# Patient Record
Sex: Male | Born: 1995 | Race: Black or African American | Hispanic: No | Marital: Single | Smoking: Former smoker
Health system: Southern US, Community
[De-identification: ages and names within clinical notes are randomized; demographics above are authoritative.]

## PROBLEM LIST (undated history)

## (undated) DIAGNOSIS — L309 Dermatitis, unspecified: Secondary | ICD-10-CM

## (undated) DIAGNOSIS — F32A Depression, unspecified: Secondary | ICD-10-CM

## (undated) DIAGNOSIS — E119 Type 2 diabetes mellitus without complications: Secondary | ICD-10-CM

## (undated) DIAGNOSIS — C801 Malignant (primary) neoplasm, unspecified: Secondary | ICD-10-CM

## (undated) DIAGNOSIS — E669 Obesity, unspecified: Secondary | ICD-10-CM

## (undated) HISTORY — PX: NO PAST SURGERIES: SHX2092

## (undated) HISTORY — DX: Type 2 diabetes mellitus without complications: E11.9

## (undated) HISTORY — DX: Depression, unspecified: F32.A

## (undated) HISTORY — DX: Dermatitis, unspecified: L30.9

## (undated) HISTORY — DX: Malignant (primary) neoplasm, unspecified: C80.1

---
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
## 2015-10-07 ENCOUNTER — Emergency Department (HOSPITAL_COMMUNITY)
Admission: EM | Admit: 2015-10-07 | Discharge: 2015-10-07 | Disposition: A | Discharge status: Home / Self Care | Payer: Medicaid Other | Attending: Emergency Medicine | Admitting: Emergency Medicine

## 2015-10-07 ENCOUNTER — Encounter (HOSPITAL_COMMUNITY): Payer: Self-pay | Admitting: Emergency Medicine

## 2015-10-07 DIAGNOSIS — G44209 Tension-type headache, unspecified, not intractable: Secondary | ICD-10-CM | POA: Diagnosis not present

## 2015-10-07 DIAGNOSIS — F17210 Nicotine dependence, cigarettes, uncomplicated: Secondary | ICD-10-CM | POA: Insufficient documentation

## 2015-10-07 DIAGNOSIS — Z79899 Other long term (current) drug therapy: Secondary | ICD-10-CM | POA: Insufficient documentation

## 2015-10-07 DIAGNOSIS — R51 Headache: Secondary | ICD-10-CM | POA: Diagnosis present

## 2015-10-07 HISTORY — DX: Obesity, unspecified: E66.9

## 2015-10-07 MED ORDER — SODIUM CHLORIDE 0.9 % IV SOLN
INTRAVENOUS | Status: DC
  Administered 2015-10-07: 20:00:00 via INTRAVENOUS

## 2015-10-07 MED ORDER — DEXAMETHASONE SODIUM PHOSPHATE 10 MG/ML IJ SOLN
10.0000 mg | Freq: Once | INTRAMUSCULAR | Status: AC
  Administered 2015-10-07: 10 mg via INTRAVENOUS
  Filled 2015-10-07: qty 1

## 2015-10-07 MED ORDER — METOCLOPRAMIDE HCL 5 MG/ML IJ SOLN
10.0000 mg | Freq: Once | INTRAMUSCULAR | Status: AC
  Administered 2015-10-07: 10 mg via INTRAVENOUS
  Filled 2015-10-07: qty 2

## 2015-10-07 MED ORDER — DIPHENHYDRAMINE HCL 50 MG/ML IJ SOLN
25.0000 mg | Freq: Once | INTRAMUSCULAR | Status: AC
  Administered 2015-10-07: 25 mg via INTRAVENOUS
  Filled 2015-10-07: qty 1

## 2015-10-07 NOTE — ED Provider Notes (Signed)
CSN: [id number]     Arrival date & time 10/07/15  1914 History  By signing my name below, I, Soijett Blue, attest that this documentation has been prepared under the direction and in the presence of Debroah Baller, NP Electronically Signed: Soijett Blue, ED Scribe. 10/07/2015. 7:41 PM.   Chief Complaint  Patient presents with  . Headache     Patient is a 20 y.o. male presenting with headaches. The history is provided by the patient. No language interpreter was used.  Headache Pain location:  Frontal Quality: aching. Radiates to:  Does not radiate Severity currently:  6/10 Severity at highest:  8/10 Onset quality:  Sudden Duration:  1 day Timing:  Constant Progression:  Improving Chronicity:  Recurrent Similar to prior headaches: yes   Relieved by:  Nothing Worsened by:  Activity Ineffective treatments:  NSAIDs Associated symptoms: dizziness and nausea   Associated symptoms: no blurred vision, no fever, no neck stiffness and no vomiting     HPI Comments: Mitchell Steele is a 20 y.o. male with a medical hx of migraine who presents to the Emergency Department complaining of 6/10, constant, aching, frontal HA onset yesterday afternoon. Pt reports that his HA earlier today was 8-9/10 and his HA has mildly improved since onset. He notes that this HA is similar to headaches that he has had in the past. Pt is unsure of who diagnosed him with migraines. Pt notes that he does have a PCP. He states that he is having associated symptoms of mild dizziness, nausea, and appetite change due to nausea. Pt notes that when he has a HA, he typically will have nausea and appetite change. He states that he has tried OTC ibuprofen with some relief for his symptoms. He denies vomiting, fever, chills, neck stiffness, blurred vision, bowel/bladder incontinence, vision change, dental pain, and any other symptoms. Denies allergies to any medications. Denies PMHx of HTN or DM.    Past Medical History  Diagnosis Date   . Eczema   . Migraine   . Obesity    History reviewed. No pertinent past surgical history. Family History  Problem Relation Age of Onset  . Diabetes Mother   . Diabetes Maternal Grandmother   . Hypertension Maternal Grandmother   . Heart attack Maternal Grandmother   . Heart disease Maternal Grandmother   . Alcohol abuse Maternal Uncle    Social History  Substance Use Topics  . Smoking status: Current Every Day Smoker -- 0.00 packs/day    Types: Cigars, Cigarettes  . Smokeless tobacco: None  . Alcohol Use: No    Review of Systems  Constitutional: Negative for fever and chills.  Eyes: Negative for blurred vision and visual disturbance.  Gastrointestinal: Positive for nausea. Negative for vomiting.       No bowel incontinence  Genitourinary:       No bladder incontinence  Musculoskeletal: Negative for neck stiffness.  Neurological: Positive for dizziness and headaches.  All other systems reviewed and are negative.     Allergies  Review of patient's allergies indicates no known allergies.  Home Medications   Prior to Admission medications   Medication Sig Start Date End Date Taking? Authorizing Provider  ibuprofen (ADVIL,MOTRIN) 600 MG tablet Take 1 tablet (600 mg total) by mouth every 6 (six) hours as needed for pain. For headache 03/19/11  Yes Clovis Cao, MD  naproxen (NAPROSYN) 500 MG tablet Take 1 tablet (500 mg total) by mouth 2 (two) times daily with a meal. 02/27/14  Yes  Renee A Kuneff, DO  carbamide peroxide (DEBROX) 6.5 % otic solution Place 5 drops into both ears 2 (two) times daily. 02/27/14   Renee A Kuneff, DO  cyclobenzaprine (FLEXERIL) 10 MG tablet Take 1 tablet (10 mg total) by mouth 3 (three) times daily as needed for muscle spasms. 02/27/14   Renee A Kuneff, DO  escitalopram (LEXAPRO) 20 MG tablet Take 1 tablet (20 mg total) by mouth daily after breakfast. Patient not taking: Reported on 10/07/2015 02/14/12   Aurelio Jew, NP  ondansetron (ZOFRAN) 4  MG tablet Take 1 tablet (4 mg total) by mouth every 8 (eight) hours as needed for nausea or vomiting. 05/28/13   Lind Covert, MD  polyethylene glycol powder (GLYCOLAX/MIRALAX) powder Take 17 g by mouth 2 (two) times daily as needed. 04/03/12   Clovis Cao, MD   BP 123/60 mmHg  Pulse 62  Temp(Src) 98.9 F (37.2 C) (Oral)  Resp 20  Ht 5\' 5"  (1.651 m)  Wt 107.049 kg  BMI 39.27 kg/m2  SpO2 99% Physical Exam  Constitutional: He is oriented to person, place, and time. He appears well-developed and well-nourished. No distress.  HENT:  Head: Normocephalic and atraumatic.  Right Ear: Tympanic membrane normal.  Left Ear: Tympanic membrane normal.  Nose: Nose normal.  Mouth/Throat: Uvula is midline, oropharynx is clear and moist and mucous membranes are normal.  Eyes: Conjunctivae and EOM are normal. Pupils are equal, round, and reactive to light.  Neck: Normal range of motion. Neck supple.  Full ROM without pain.   Cardiovascular: Normal rate, regular rhythm and normal heart sounds.   Pulses:      Radial pulses are 2+ on the right side, and 2+ on the left side.  Radial and dorsalis pedis pulse 2+.   Pulmonary/Chest: Effort normal and breath sounds normal. No respiratory distress. He has no wheezes. He has no rales.  Abdominal: Soft. Bowel sounds are normal. He exhibits no distension. There is no tenderness.  Musculoskeletal: Normal range of motion. He exhibits no edema.  Radial and pedal pulses strong, adequate circulation, good touch sensation.  Neurological: He is alert and oriented to person, place, and time. He has normal strength. No cranial nerve deficit or sensory deficit. He displays a negative Romberg sign. Gait normal.  Reflex Scores:      Bicep reflexes are 2+ on the right side and 2+ on the left side.      Brachioradialis reflexes are 2+ on the right side and 2+ on the left side.      Patellar reflexes are 2+ on the right side and 2+ on the left side. Grip strength  equal bilaterally. Negative Romberg.   Skin: Skin is warm and dry.  Psychiatric: He has a normal mood and affect. His behavior is normal.  Nursing note and vitals reviewed.   ED Course  Procedures (including critical care time) DIAGNOSTIC STUDIES: Oxygen Saturation is 99% on RA, nl by my interpretation.    COORDINATION OF CARE: 7:40 PM Discussed treatment plan with pt at bedside which includes IV fluids, pain medication, and pt agreed to plan.   9:14 PM- Pt re-evaluated and HA has improved to a 2/10 following treatment of IV normal saline, decadron, benadryl, and Reglan. Pt is able to tolerate PO fluids and foods.    MDM   Final diagnoses:  Tension headache    Pt HA treated and improved while in ED.  Presentation is like pts typical HA and non concerning for [facility],  ICH, Meningitis, or temporal arteritis. Pt is afebrile with no focal neuro deficits, nuchal rigidity, or change in vision. Pt is to follow up with PCP to discuss prophylactic medication. Pt verbalizes understanding and is agreeable with plan to dc.   I personally performed the services described in this documentation, which was scribed in my presence. The recorded information has been reviewed and is accurate.    Amorita, NP 10/08/15 [id number]  Harvel Quale, MD 10/09/15 [phone]

## 2015-10-07 NOTE — ED Notes (Signed)
Pt. reports headache onset yesterday unrelieved by OTC ibuprofen , denies head injury , no emesis or fever .

## 2015-12-15 ENCOUNTER — Ambulatory Visit (HOSPITAL_COMMUNITY)
Admission: EM | Admit: 2015-12-15 | Discharge: 2015-12-15 | Disposition: A | Discharge status: Home / Self Care | Payer: Medicaid Other | Attending: Family Medicine | Admitting: Family Medicine

## 2015-12-15 ENCOUNTER — Encounter (HOSPITAL_COMMUNITY): Payer: Self-pay | Admitting: Family Medicine

## 2015-12-15 DIAGNOSIS — F329 Major depressive disorder, single episode, unspecified: Secondary | ICD-10-CM | POA: Insufficient documentation

## 2015-12-15 DIAGNOSIS — J309 Allergic rhinitis, unspecified: Secondary | ICD-10-CM | POA: Diagnosis not present

## 2015-12-15 DIAGNOSIS — R05 Cough: Secondary | ICD-10-CM | POA: Insufficient documentation

## 2015-12-15 DIAGNOSIS — E669 Obesity, unspecified: Secondary | ICD-10-CM | POA: Diagnosis not present

## 2015-12-15 DIAGNOSIS — L309 Dermatitis, unspecified: Secondary | ICD-10-CM | POA: Diagnosis not present

## 2015-12-15 DIAGNOSIS — J029 Acute pharyngitis, unspecified: Secondary | ICD-10-CM | POA: Diagnosis present

## 2015-12-15 DIAGNOSIS — G44229 Chronic tension-type headache, not intractable: Secondary | ICD-10-CM | POA: Diagnosis not present

## 2015-12-15 DIAGNOSIS — Z202 Contact with and (suspected) exposure to infections with a predominantly sexual mode of transmission: Secondary | ICD-10-CM | POA: Insufficient documentation

## 2015-12-15 DIAGNOSIS — J020 Streptococcal pharyngitis: Secondary | ICD-10-CM

## 2015-12-15 DIAGNOSIS — L83 Acanthosis nigricans: Secondary | ICD-10-CM | POA: Diagnosis not present

## 2015-12-15 DIAGNOSIS — F419 Anxiety disorder, unspecified: Secondary | ICD-10-CM | POA: Insufficient documentation

## 2015-12-15 DIAGNOSIS — F17210 Nicotine dependence, cigarettes, uncomplicated: Secondary | ICD-10-CM | POA: Insufficient documentation

## 2015-12-15 LAB — POCT RAPID STREP A: STREPTOCOCCUS, GROUP A SCREEN (DIRECT): NEGATIVE

## 2015-12-15 MED ORDER — IPRATROPIUM BROMIDE 0.06 % NA SOLN: 2.0000 | Freq: Four times a day (QID) | NASAL | 1 refills | Status: DC

## 2015-12-15 MED ORDER — AZITHROMYCIN 250 MG PO TABS: ORAL_TABLET | ORAL | 0 refills | Status: DC

## 2015-12-15 NOTE — ED Provider Notes (Signed)
[place]    CSN: [id number] Arrival date & time: 12/15/15  1143  First Provider Contact:  First MD Initiated Contact with Patient 12/15/15 1326        History   Chief Complaint Chief Complaint  Patient presents with  . Sore Throat  . Cough    HPI Mitchell Steele is a 20 y.o. male.    Sore Throat  This is a new problem. The current episode started yesterday. The problem has not changed since onset.The symptoms are aggravated by swallowing. Nothing relieves the symptoms.    Past Medical History:  Diagnosis Date  . Eczema   . Migraine   . Obesity     Patient Active Problem List   Diagnosis Date Noted  . Chronic tension headaches 02/27/2014  . Cerumen impaction 02/27/2014  . Gastroenteritis 05/28/2013  . Exposure to STD 07/04/2012  . Constipation 04/03/2012  . Screening for STD (sexually transmitted disease) 04/03/2012  . Suicide attempt ([place]) 02/08/2012  . Depression 02/08/2012  . Anxiety 02/08/2012  . 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 Allergic rhinitis, seasonal 02/19/2011  . MIGRAINE HEADACHE 04/10/2010  . ECZEMA 03/21/2009  . 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 No pertinent surgical history.     Home Medications    Prior to Admission medications   Not on File    Family History Family History  Problem Relation Age of Onset  . Diabetes Mother   . Diabetes Maternal Grandmother   . Hypertension Maternal Grandmother   . Heart attack Maternal Grandmother   . Heart disease Maternal Grandmother   . Alcohol abuse Maternal Uncle     Social History Social History  Substance Use Topics  . Smoking status: Current Every Day Smoker    Packs/day: 0.00    Types: Cigars, Cigarettes  . Smokeless tobacco: Never Used  . Alcohol use No     Allergies   Review of patient's allergies indicates no known allergies.   Review of Systems Review of Systems  Constitutional: Positive for chills and fever.  HENT: Positive  for congestion, rhinorrhea and sore throat.   Respiratory: Negative.   Cardiovascular: Negative.   Skin: Negative.   All other systems reviewed and are negative.    Physical Exam Triage Vital Signs ED Triage Vitals  Enc Vitals Group     BP 12/15/15 1316 145/65     Pulse Rate 12/15/15 1316 83     Resp 12/15/15 1316 18     Temp 12/15/15 1316 100.3 F (37.9 C)     Temp src --      SpO2 12/15/15 1316 98 %     Weight --      Height --      Head Circumference --      Peak Flow --      Pain Score 12/15/15 1318 5     Pain Loc --      Pain Edu? --      Excl. in [place]? --    No data found.   Updated Vital Signs BP 145/65   Pulse 83   Temp 100.3 F (37.9 C)   Resp 18   SpO2 98%   Visual Acuity Right Eye Distance:   Left Eye Distance:   Bilateral Distance:    Right Eye Near:   Left Eye Near:    Bilateral Near:     Physical Exam  Constitutional: He appears well-developed and well-nourished.  HENT:  Right Ear: External ear  normal.  Left Ear: External ear normal.  Mouth/Throat: No oropharyngeal exudate.  Eyes: Pupils are equal, round, and reactive to light.  Neck: Normal range of motion. Neck supple.  Pulmonary/Chest: Effort normal and breath sounds normal.  Lymphadenopathy:    He has no cervical adenopathy.  Nursing note and vitals reviewed.    UC Treatments / Results  Labs (all labs ordered are listed, but only abnormal results are displayed) Labs Reviewed  POCT RAPID STREP A   Strep neg. EKG  EKG Interpretation None       Radiology No results found.  Procedures Procedures (including critical care time)  Medications Ordered in UC Medications - No data to display   Initial Impression / Assessment and Plan / UC Course  I have reviewed the triage vital signs and the nursing notes.  Pertinent labs & imaging results that were available during my care of the patient were reviewed by me and considered in my medical decision making (see chart for  details).  Clinical Course      Final Clinical Impressions(s) / UC Diagnoses   Final diagnoses:  None    New Prescriptions New Prescriptions   No medications on file     Billy Fischer, MD 12/15/15 2125

## 2015-12-15 NOTE — ED Triage Notes (Signed)
Pt here for sore throat and cough cine yesterday.

## 2015-12-18 LAB — CULTURE, GROUP A STREP (THRC)

## 2016-05-19 ENCOUNTER — Ambulatory Visit (HOSPITAL_COMMUNITY)
Admission: EM | Admit: 2016-05-19 | Discharge: 2016-05-19 | Disposition: A | Discharge status: Other Acute Inpatient Hospital | Payer: Medicaid Other

## 2016-05-19 ENCOUNTER — Encounter (HOSPITAL_COMMUNITY): Payer: Self-pay | Admitting: Emergency Medicine

## 2016-05-19 ENCOUNTER — Emergency Department (HOSPITAL_COMMUNITY)
Admission: EM | Admit: 2016-05-19 | Discharge: 2016-05-19 | Disposition: A | Discharge status: Home / Self Care | Payer: Medicaid Other | Attending: Emergency Medicine | Admitting: Emergency Medicine

## 2016-05-19 DIAGNOSIS — J029 Acute pharyngitis, unspecified: Secondary | ICD-10-CM | POA: Insufficient documentation

## 2016-05-19 DIAGNOSIS — G43009 Migraine without aura, not intractable, without status migrainosus: Secondary | ICD-10-CM | POA: Insufficient documentation

## 2016-05-19 DIAGNOSIS — H7291 Unspecified perforation of tympanic membrane, right ear: Secondary | ICD-10-CM | POA: Insufficient documentation

## 2016-05-19 DIAGNOSIS — F17210 Nicotine dependence, cigarettes, uncomplicated: Secondary | ICD-10-CM | POA: Insufficient documentation

## 2016-05-19 LAB — RAPID STREP SCREEN (MED CTR MEBANE ONLY): Streptococcus, Group A Screen (Direct): NEGATIVE

## 2016-05-19 MED ORDER — PROCHLORPERAZINE MALEATE 5 MG PO TABS
10.0000 mg | ORAL_TABLET | Freq: Once | ORAL | Status: AC
  Administered 2016-05-19: 10 mg via ORAL
  Filled 2016-05-19: qty 2

## 2016-05-19 MED ORDER — KETOROLAC TROMETHAMINE 30 MG/ML IJ SOLN
30.0000 mg | Freq: Once | INTRAMUSCULAR | Status: AC
  Administered 2016-05-19: 30 mg via INTRAMUSCULAR
  Filled 2016-05-19: qty 1

## 2016-05-19 MED ORDER — DEXAMETHASONE 4 MG PO TABS
10.0000 mg | ORAL_TABLET | Freq: Once | ORAL | Status: AC
  Administered 2016-05-19: 10 mg via ORAL
  Filled 2016-05-19: qty 3

## 2016-05-19 MED ORDER — PROCHLORPERAZINE MALEATE 10 MG PO TABS: 10.0000 mg | ORAL_TABLET | Freq: Two times a day (BID) | ORAL | 0 refills | Status: DC | PRN

## 2016-05-19 MED ORDER — AMOXICILLIN-POT CLAVULANATE 875-125 MG PO TABS: 1.0000 | ORAL_TABLET | Freq: Two times a day (BID) | ORAL | 0 refills | Status: DC

## 2016-05-19 NOTE — ED Notes (Signed)
Rt ear pain and feels stuffed up, has had sore throat and h/a x 1 week with a cough

## 2016-05-19 NOTE — ED Triage Notes (Signed)
Pt from home with c/o headache x 2 weeks with sore throat, right ear "clogged up," fever, hot and cold chills, and high BP noted yesterday.  Pt in NAD, A&O.

## 2016-05-19 NOTE — Discharge Instructions (Signed)
You likely have viral infection.   Continue to take tylenol and ibuprofen as needed for headaches. You can add compazine for headaches as well.  Return without fail for worsening symptoms, including escalating pain, confusion, intractable vomiting or any other symptoms concerning to you.

## 2016-05-19 NOTE — ED Provider Notes (Signed)
[facility] DEPT Provider Note   CSN: [id number] Arrival date & time: 05/19/16  1105  By signing my name below, I, Reola Mosher, attest that this documentation has been prepared under the direction and in the presence of Forde Dandy, MD. Electronically Signed: Reola Mosher, ED Scribe. 05/19/16. 12:15 PM.  History   Chief Complaint Chief Complaint  Patient presents with  . Headache  . Sore Throat   The history is provided by the patient. No language interpreter was used.    HPI Comments: Mitchell Steele is a 21 y.o. male with a h/o migraines and obesity, who presents to the Emergency Department complaining of gradual onset, persistent bilateral temporal headache beginning one and a half weeks ago. He notes associated sore throat, chills, nausea, rhinorrhea, mild cough, and sensation of right ear congestion over the past three days as well. He has otherwise had normal PO intake since the onset of his symptoms. Pt notes that he has a h/o migraines and his headache today feels similar to this, but somewhat more severe than baseline. He has been taking Ibuprofen (last dose yesterday) without relief of his headache. Pt also attempted to flush his right ear with peroxide and used cotton balls in the canal at home without relief of his right ear congestion. Pt has been around sick contacts recently. He denies vomiting, diarrhea, urgency, frequency, hematuria, dysuria, difficulty urinating, congestion, neck stiffness, or any other associated symptoms.   Past Medical History:  Diagnosis Date  . Eczema   . Migraine   . Obesity    Patient Active Problem List   Diagnosis Date Noted  . Chronic tension headaches 02/27/2014  . Cerumen impaction 02/27/2014  . Gastroenteritis 05/28/2013  . Exposure to STD 07/04/2012  . Constipation 04/03/2012  . Screening for STD (sexually transmitted disease) 04/03/2012  . Suicide attempt 02/08/2012  . Depression 02/08/2012  . Anxiety 02/08/2012   . 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 Allergic rhinitis, seasonal 02/19/2011  . MIGRAINE HEADACHE 04/10/2010  . ECZEMA 03/21/2009  . 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 No pertinent surgical history.  Home Medications    Prior to Admission medications   Medication Sig Start Date End Date Taking? Authorizing Provider  amoxicillin-clavulanate (AUGMENTIN) 875-125 MG tablet Take 1 tablet by mouth every 12 (twelve) hours. 05/19/16   Forde Dandy, MD  azithromycin (ZITHROMAX Z-PAK) 250 MG tablet Take as directed on pack 12/15/15   Billy Fischer, MD  ipratropium (ATROVENT) 0.06 % nasal spray Place 2 sprays into both nostrils 4 (four) times daily. 12/15/15   Billy Fischer, MD  prochlorperazine (COMPAZINE) 10 MG tablet Take 1 tablet (10 mg total) by mouth 2 (two) times daily as needed for nausea or vomiting. 05/19/16   Forde Dandy, MD   Family History Family History  Problem Relation Age of Onset  . Diabetes Mother   . Diabetes Maternal Grandmother   . Hypertension Maternal Grandmother   . Heart attack Maternal Grandmother   . Heart disease Maternal Grandmother   . Alcohol abuse Maternal Uncle    Social History Social History  Substance Use Topics  . Smoking status: Current Every Day Smoker    Packs/day: 0.00    Types: Cigars, Cigarettes  . Smokeless tobacco: Never Used  . Alcohol use No   Allergies   Patient has no known allergies.  Review of Systems Review of Systems 10/14 systems reviewed and are negative other than those stated in the HPI  Physical Exam Updated Vital Signs BP 135/85 (BP Location: Right Arm)   Pulse 99   Temp 98.9 F (37.2 C) (Oral)   Resp 20   Ht 5\' 5"  (1.651 m)   Wt 220 lb (99.8 kg)   SpO2 99%   BMI 36.61 kg/m   Physical Exam  Physical Exam  Nursing note and vitals reviewed. Constitutional: Well developed, well nourished, non-toxic, and in no acute distress Head: Normocephalic and atraumatic.  Mouth/Throat: Posterior  oropharynx is mildly erythematous, no swelling or exudates.  Ear: Left TM normal. Right TM appears perforated and there is some bleeding in the external canal.   Neck: Normal range of motion. Neck supple. No meningismus.  Cardiovascular: Normal rate and regular rhythm.   Pulmonary/Chest: Effort normal and breath sounds normal.  Abdominal: Soft. There is no tenderness. There is no rebound and no guarding.  Musculoskeletal: Normal range of motion.  Neurological: Alert, no facial droop, fluent speech, moves all extremities symmetrically, normal gait, sensation to light touch in tact throughout PERRL Skin: Skin is warm and dry.  Psychiatric: Cooperativ  ED Treatments / Results  DIAGNOSTIC STUDIES: Oxygen Saturation is 99% on RA, normal by my interpretation.   COORDINATION OF CARE: 12:15 PM-Discussed next steps with pt. Pt verbalized understanding and is agreeable with the plan.   Labs (all labs ordered are listed, but only abnormal results are displayed) Labs Reviewed  RAPID STREP SCREEN (NOT AT [facility])  CULTURE, GROUP A STREP [facility])   EKG  EKG Interpretation None      Radiology No results found.  Procedures Procedures   Medications Ordered in ED Medications  ketorolac (TORADOL) 30 MG/ML injection 30 mg (30 mg Intramuscular Given 05/19/16 1226)  prochlorperazine (COMPAZINE) tablet 10 mg (10 mg Oral Given 05/19/16 1224)  dexamethasone (DECADRON) tablet 10 mg (10 mg Oral Given 05/19/16 1224)   Initial Impression / Assessment and Plan / ED Course  I have reviewed the triage vital signs and the nursing notes.  Pertinent labs & imaging results that were available during my care of the patient were reviewed by me and considered in my medical decision making (see chart for details).     Presenting with headache, sore throat, and congestion with right ear pain. Headache consistent with prior migraine headaches. No concerning features suggestive of meningitis. Not of sudden onset  maximal intensity c/f SAH. Normal neuro exam. At this time, no concerns for serious intracranial processes.   Rapid strep test is negative. Sore throat is likely viral in etiology. There is perforation of the right TM, unclear if he may have been due to otitis media versus self rupture while he was cleaning his ear. We will give course of antibiotics for this and ENT follow-up as needed.  Discussed supportive management for likely viral illness. Discuss migraine headache management for home. Strict return and follow-up instructions reviewed. He expressed understanding of all discharge instructions and felt comfortable with the plan of care.    Final Clinical Impressions(s) / ED Diagnoses   Final diagnoses:  Viral pharyngitis  Migraine without aura and without status migrainosus, not intractable  Perforation of right tympanic membrane    New Prescriptions New Prescriptions   AMOXICILLIN-CLAVULANATE (AUGMENTIN) 875-125 MG TABLET    Take 1 tablet by mouth every 12 (twelve) hours.   PROCHLORPERAZINE (COMPAZINE) 10 MG TABLET    Take 1 tablet (10 mg total) by mouth 2 (two) times daily as needed for nausea or vomiting.   I personally performed the services described  in this documentation, which was scribed in my presence. The recorded information has been reviewed and is accurate.     Forde Dandy, MD 05/19/16 1332

## 2016-05-22 LAB — CULTURE, GROUP A STREP (THRC)

## 2017-04-01 ENCOUNTER — Encounter (HOSPITAL_COMMUNITY): Payer: Self-pay | Admitting: Student

## 2017-04-01 ENCOUNTER — Emergency Department (HOSPITAL_COMMUNITY)
Admission: EM | Admit: 2017-04-01 | Discharge: 2017-04-01 | Disposition: A | Discharge status: Home / Self Care | Payer: Self-pay | Attending: Emergency Medicine | Admitting: Emergency Medicine

## 2017-04-01 DIAGNOSIS — J029 Acute pharyngitis, unspecified: Secondary | ICD-10-CM | POA: Insufficient documentation

## 2017-04-01 DIAGNOSIS — R05 Cough: Secondary | ICD-10-CM | POA: Insufficient documentation

## 2017-04-01 DIAGNOSIS — F17210 Nicotine dependence, cigarettes, uncomplicated: Secondary | ICD-10-CM | POA: Insufficient documentation

## 2017-04-01 DIAGNOSIS — Z79899 Other long term (current) drug therapy: Secondary | ICD-10-CM | POA: Insufficient documentation

## 2017-04-01 DIAGNOSIS — B349 Viral infection, unspecified: Secondary | ICD-10-CM

## 2017-04-01 DIAGNOSIS — B9789 Other viral agents as the cause of diseases classified elsewhere: Secondary | ICD-10-CM | POA: Insufficient documentation

## 2017-04-01 MED ORDER — IBUPROFEN 800 MG PO TABS
800.0000 mg | ORAL_TABLET | Freq: Once | ORAL | Status: AC
  Administered 2017-04-01: 800 mg via ORAL
  Filled 2017-04-01: qty 1

## 2017-04-01 MED ORDER — FLUTICASONE PROPIONATE 50 MCG/ACT NA SUSP: 1.0000 | Freq: Every day | NASAL | 2 refills | Status: DC

## 2017-04-01 MED ORDER — BENZONATATE 100 MG PO CAPS: 100.0000 mg | ORAL_CAPSULE | Freq: Three times a day (TID) | ORAL | 0 refills | Status: DC | PRN

## 2017-04-01 MED ORDER — IBUPROFEN 800 MG PO TABS: 800.0000 mg | ORAL_TABLET | Freq: Three times a day (TID) | ORAL | 0 refills | Status: DC

## 2017-04-01 NOTE — ED Triage Notes (Signed)
Pt reports a headache with non productive cough and sore throat for there last 2 days.

## 2017-04-01 NOTE — ED Provider Notes (Signed)
[facility] EMERGENCY DEPARTMENT Provider Note   CSN: [id number] Arrival date & time: 04/01/17  1419     History   Chief Complaint Chief Complaint  Patient presents with  . Cough  . Sore Throat    HPI Mitchell Steele is a 21 y.o. male who presents the emergency department complaining of headache, congestion, and cough for the past 3 days. Patient states symptoms started with congestion and rhinorrhea, then started to have a frontal headache.  Describes headache as an aching sensation that has been constant since its gradual onset 3 days ago.  Patient also with dry cough and mild sore throat. States his chest hurts a little with coughing, otherwise no chest pain. He has not tried anything at home for his symptoms.  No significant alleviating or aggravating factors.  Denies change in vision, numbness, weakness, ear pain, abdominal pain, or N/V/D. Denies fever or chills.   HPI  Past Medical History:  Diagnosis Date  . Eczema   . Migraine   . Obesity     Patient Active Problem List   Diagnosis Date Noted  . Chronic tension headaches 02/27/2014  . Cerumen impaction 02/27/2014  . Gastroenteritis 05/28/2013  . Exposure to STD 07/04/2012  . Constipation 04/03/2012  . Screening for STD (sexually transmitted disease) 04/03/2012  . Suicide attempt ([place]) 02/08/2012  . Depression 02/08/2012  . Anxiety 02/08/2012  . 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 Allergic rhinitis, seasonal 02/19/2011  . MIGRAINE HEADACHE 04/10/2010  . ECZEMA 03/21/2009  . 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 No pertinent surgical history.     Home Medications    Prior to Admission medications   Medication Sig Start Date End Date Taking? Authorizing Provider  amoxicillin-clavulanate (AUGMENTIN) 875-125 MG tablet Take 1 tablet by mouth every 12 (twelve) hours. 05/19/16   Forde Dandy, MD  azithromycin (ZITHROMAX Z-PAK) 250 MG tablet Take as directed on pack 12/15/15    Billy Fischer, MD  ipratropium (ATROVENT) 0.06 % nasal spray Place 2 sprays into both nostrils 4 (four) times daily. 12/15/15   Billy Fischer, MD  prochlorperazine (COMPAZINE) 10 MG tablet Take 1 tablet (10 mg total) by mouth 2 (two) times daily as needed for nausea or vomiting. 05/19/16   Forde Dandy, MD    Family History Family History  Problem Relation Age of Onset  . Diabetes Mother   . Diabetes Maternal Grandmother   . Hypertension Maternal Grandmother   . Heart attack Maternal Grandmother   . Heart disease Maternal Grandmother   . Alcohol abuse Maternal Uncle     Social History Social History   Tobacco Use  . Smoking status: Current Every Day Smoker    Packs/day: 0.00    Types: Cigars, Cigarettes  . Smokeless tobacco: Never Used  Substance Use Topics  . Alcohol use: No    Alcohol/week: 0.0 oz  . Drug use: No     Allergies   Patient has no known allergies.   Review of Systems Review of Systems  Constitutional: Negative for chills and fever.  HENT: Positive for congestion, rhinorrhea, sinus pressure and sore throat. Negative for ear pain.   Eyes: Negative for pain, discharge, redness, itching and visual disturbance.  Respiratory: Positive for cough. Negative for shortness of breath.   Cardiovascular: Positive for chest pain (only with coughing). Negative for palpitations.  Gastrointestinal: Negative for abdominal pain, diarrhea, nausea and vomiting.  Neurological: Positive for headaches. Negative for dizziness, weakness,  light-headedness and numbness.  All other systems reviewed and are negative.    Physical Exam Updated Vital Signs BP 127/62 (BP Location: Right Arm)   Pulse (!) 104   Temp 98.8 F (37.1 C) (Oral)   Resp 16   Ht 5\' 5"  (1.651 m)   Wt 97.5 kg (215 lb)   SpO2 97%   BMI 35.78 kg/m   Physical Exam  Constitutional: He appears well-developed and well-nourished.  Non-toxic appearance. No distress.  HENT:  Head: Normocephalic and  atraumatic.  Right Ear: No mastoid tenderness.  Left Ear: No mastoid tenderness.  Nose: Mucosal edema and rhinorrhea present. Right sinus exhibits no maxillary sinus tenderness and no frontal sinus tenderness. Left sinus exhibits no maxillary sinus tenderness and no frontal sinus tenderness.  Mouth/Throat: Uvula is midline and oropharynx is clear and moist. No oropharyngeal exudate, posterior oropharyngeal edema or posterior oropharyngeal erythema.  Ears: unable to visualize TM well due to cerumen in EAC bilaterally   Eyes: Conjunctivae and EOM are normal. Pupils are equal, round, and reactive to light. Right eye exhibits no discharge. Left eye exhibits no discharge.  Neck: Normal range of motion. Neck supple.  Cardiovascular: Normal rate and regular rhythm.  No murmur heard. Pulmonary/Chest: Breath sounds normal. No accessory muscle usage. No respiratory distress. He has no wheezes. He has no rhonchi. He has no rales.  Abdominal: Soft. He exhibits no distension. There is no tenderness.  Neurological: He is alert.  Clear speech. CNIII-XII intact. 5/5 grip strength bilaterally. Sensation grossly intact to bilateral upper and lower extremities.   Skin: Skin is warm and dry. No rash noted.  Psychiatric: He has a normal mood and affect. His behavior is normal.  Nursing note and vitals reviewed.   ED Treatments / Results  Labs (all labs ordered are listed, but only abnormal results are displayed) Labs Reviewed - No data to display  EKG  EKG Interpretation None      Radiology No results found.  Procedures Procedures (including critical care time)  Medications Ordered in ED Medications  ibuprofen (ADVIL,MOTRIN) tablet 800 mg (not administered)     Initial Impression / Assessment and Plan / ED Course  I have reviewed the triage vital signs and the nursing notes.  Pertinent labs & imaging results that were available during my care of the patient were reviewed by me and considered  in my medical decision making (see chart for details).    Patient presents with symptoms consistent with viral illness. He is nontoxic appearing with stable vital signs- initially tachycardic at 104, this improved. Patient is afebrile and without adventitious sounds on lung exam, doubt PNA. No wheezing on exam. Afebrile, no sinus tenderness, doubt sinusitis. Centor score 0, doubt strep pharyngitis. Patient without neurologic deficits on exam, no reported sxs of change in vision, numbness, or weakness, headache with gradual onset and gradual worsening, no maximum intensity at onset, suspect headache related to URI sxs. Suspect viral etiology, will treat supportively with Ibuprofen, Flonase, and Tessalon. I discussed results, treatment plan, need for PCP follow-up, and return precautions with the patient. Provided opportunity for questions, patient confirmed understanding and is in agreement with plan.   Final Clinical Impressions(s) / ED Diagnoses   Final diagnoses:  Viral illness    ED Discharge Orders        Ordered    ibuprofen (ADVIL,MOTRIN) 800 MG tablet  3 times daily     04/01/17 1544    benzonatate (TESSALON) 100 MG capsule  3 times  daily PRN     04/01/17 1544    fluticasone (FLONASE) 50 MCG/ACT nasal spray  Daily     04/01/17 [address] [place], PA-C 04/01/17 1552    Pattricia Boss, MD 04/04/17 1726

## 2017-04-01 NOTE — Discharge Instructions (Signed)
You were seen in the emergency today and diagnosed with a viral illness.  I have prescribed you multiple medications to treat your symptoms.   -Flonase to be used 1 spray in each nostril daily.  This medication is used to treat your congestion.  -Tessalon can be taken once every 8 hours as needed.  This medication is used to treat your cough.  -Ibuprofen to be taken once every 8 hours as needed for pain.You may also supplement this with Tylenol- please follow dosing instructions on the Tylenol bottle.   You will need to follow-up with your primary care provider in 1 week if your symptoms have not improved.  If you do not have a primary care provider one is provided in your discharge instructions.  Return to the emergency department for any new or worsening symptoms including but not limited to persistent fever for 5 days, difficulty breathing, chest pain, or passing out.

## 2019-12-06 ENCOUNTER — Other Ambulatory Visit: Payer: Self-pay

## 2019-12-06 ENCOUNTER — Emergency Department (HOSPITAL_COMMUNITY)
Admission: EM | Admit: 2019-12-06 | Discharge: 2019-12-06 | Disposition: A | Discharge status: Home / Self Care | Payer: HRSA Program | Attending: Emergency Medicine | Admitting: Emergency Medicine

## 2019-12-06 ENCOUNTER — Encounter (HOSPITAL_COMMUNITY): Payer: Self-pay | Admitting: *Deleted

## 2019-12-06 ENCOUNTER — Emergency Department (HOSPITAL_COMMUNITY): Payer: HRSA Program

## 2019-12-06 DIAGNOSIS — R5383 Other fatigue: Secondary | ICD-10-CM | POA: Diagnosis present

## 2019-12-06 DIAGNOSIS — U071 COVID-19: Secondary | ICD-10-CM | POA: Diagnosis not present

## 2019-12-06 DIAGNOSIS — F17290 Nicotine dependence, other tobacco product, uncomplicated: Secondary | ICD-10-CM | POA: Diagnosis not present

## 2019-12-06 LAB — COMPREHENSIVE METABOLIC PANEL
ALT: 61 U/L — ABNORMAL HIGH (ref 0–44)
AST: 44 U/L — ABNORMAL HIGH (ref 15–41)
Albumin: 3.7 g/dL (ref 3.5–5.0)
Alkaline Phosphatase: 62 U/L (ref 38–126)
Anion gap: 9 (ref 5–15)
BUN: 9 mg/dL (ref 6–20)
CO2: 24 mmol/L (ref 22–32)
Calcium: 8.5 mg/dL — ABNORMAL LOW (ref 8.9–10.3)
Chloride: 104 mmol/L (ref 98–111)
Creatinine, Ser: 0.92 mg/dL (ref 0.61–1.24)
GFR calc Af Amer: >60 mL/min (ref >60)
GFR calc non Af Amer: >60 mL/min (ref >60)
Glucose, Bld: 119 mg/dL — ABNORMAL HIGH (ref 70–99)
Potassium: 4.2 mmol/L (ref 3.5–5.1)
Sodium: 137 mmol/L (ref 135–145)
Total Bilirubin: 0.7 mg/dL (ref 0.3–1.2)
Total Protein: 7.2 g/dL (ref 6.5–8.1)

## 2019-12-06 LAB — SARS CORONAVIRUS 2 BY RT PCR (HOSPITAL ORDER, PERFORMED IN ~~LOC~~ HOSPITAL LAB): SARS Coronavirus 2: POSITIVE — AB

## 2019-12-06 MED ORDER — ACETAMINOPHEN 500 MG PO TABS
500.0000 mg | ORAL_TABLET | Freq: Once | ORAL | Status: AC
  Administered 2019-12-06: 500 mg via ORAL
  Filled 2019-12-06: qty 1

## 2019-12-06 MED ORDER — DEXAMETHASONE 4 MG PO TABS: 6.0000 mg | ORAL_TABLET | Freq: Once | ORAL | Status: DC

## 2019-12-06 MED ORDER — SODIUM CHLORIDE 0.9 % IV BOLUS
1000.0000 mL | Freq: Once | INTRAVENOUS | Status: AC
  Administered 2019-12-06: 1000 mL via INTRAVENOUS

## 2019-12-06 MED ORDER — IBUPROFEN 400 MG PO TABS
600.0000 mg | ORAL_TABLET | Freq: Once | ORAL | Status: AC
  Administered 2019-12-06: 600 mg via ORAL
  Filled 2019-12-06: qty 1

## 2019-12-06 MED ORDER — DEXAMETHASONE SODIUM PHOSPHATE 10 MG/ML IJ SOLN
6.0000 mg | Freq: Once | INTRAMUSCULAR | Status: AC
  Administered 2019-12-06: 6 mg via INTRAVENOUS
  Filled 2019-12-06: qty 1

## 2019-12-06 MED ORDER — ACETAMINOPHEN 500 MG PO TABS: 1000.0000 mg | ORAL_TABLET | Freq: Once | ORAL | Status: DC

## 2019-12-06 MED ORDER — SODIUM CHLORIDE 0.9 % IV BOLUS
500.0000 mL | Freq: Once | INTRAVENOUS | Status: AC
  Administered 2019-12-06: 500 mL via INTRAVENOUS

## 2019-12-06 NOTE — ED Notes (Signed)
Ambulated pt in room. Initial SPO2 was 97 and stayed at 97 while ambulating. No complaints of SOB or dizziness. Pt steady on feet.

## 2019-12-06 NOTE — ED Provider Notes (Signed)
[place] EMERGENCY DEPARTMENT Provider Note   CSN: [id number] Arrival date & time: 12/06/19  0900     History Chief Complaint  Patient presents with  . Headache  . Fever    Mitchell Steele is a 24 y.o. male with past medical history significant for eczema, migraines, obesity.  Did not have Covid vaccinations.  HPI Presents to emergency department today with chief complaint of generalized fatigue, headache, sore throat x3 days. He is also endorsing nonproductive cough and subjective fever. He has not tried any medications for symptoms prior to arrival. Multiple family members at home with similar symptoms. His headache has progressively worsened since onset and he denies any neck pain or stiffness.Denies weight loss, syncope, head trauma, photophobia,  phonophobia, UL throbbing, N/V, visual changes, rash, or "thunderclap" onset.    Past Medical History:  Diagnosis Date  . Eczema   . Migraine   . Obesity     Patient Active Problem List   Diagnosis Date Noted  . Chronic tension headaches 02/27/2014  . Cerumen impaction 02/27/2014  . Gastroenteritis 05/28/2013  . Exposure to STD 07/04/2012  . Constipation 04/03/2012  . Screening for STD (sexually transmitted disease) 04/03/2012  . Suicide attempt ([place]) 02/08/2012  . Depression 02/08/2012  . Anxiety 02/08/2012  . 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 Allergic rhinitis, seasonal 02/19/2011  . MIGRAINE HEADACHE 04/10/2010  . ECZEMA 03/21/2009  . 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 No pertinent surgical history.     Family History  Problem Relation Age of Onset  . Diabetes Mother   . Diabetes Maternal Grandmother   . Hypertension Maternal Grandmother   . Heart attack Maternal Grandmother   . Heart disease Maternal Grandmother   . Alcohol abuse Maternal Uncle     Social History   Tobacco Use  . Smoking status: Current Every Day Smoker    Packs/day: 0.00    Types: Cigars,  Cigarettes  . Smokeless tobacco: Never Used  Substance Use Topics  . Alcohol use: No    Alcohol/week: 0.0 standard drinks  . Drug use: No    Home Medications Prior to Admission medications   Medication Sig Start Date End Date Taking? Authorizing Provider  amoxicillin-clavulanate (AUGMENTIN) 875-125 MG tablet Take 1 tablet by mouth every 12 (twelve) hours. 05/19/16   Forde Dandy, MD  azithromycin (ZITHROMAX Z-PAK) 250 MG tablet Take as directed on pack 12/15/15   Billy Fischer, MD  benzonatate (TESSALON) 100 MG capsule Take 1 capsule (100 mg total) by mouth 3 (three) times daily as needed for cough. 04/01/17   Petrucelli, Samantha R, PA-C  fluticasone (FLONASE) 50 MCG/ACT nasal spray Place 1 spray into both nostrils daily. 04/01/17   Petrucelli, Samantha R, PA-C  ibuprofen (ADVIL,MOTRIN) 800 MG tablet Take 1 tablet (800 mg total) by mouth 3 (three) times daily. 04/01/17   Petrucelli, Samantha R, PA-C  ipratropium (ATROVENT) 0.06 % nasal spray Place 2 sprays into both nostrils 4 (four) times daily. 12/15/15   Billy Fischer, MD  prochlorperazine (COMPAZINE) 10 MG tablet Take 1 tablet (10 mg total) by mouth 2 (two) times daily as needed for nausea or vomiting. 05/19/16   Forde Dandy, MD    Allergies    Patient has no known allergies.  Review of Systems   Review of Systems All other systems are reviewed and are negative for acute change except as noted in the HPI.  Physical Exam Updated Vital Signs BP 115/77 (  BP Location: Left Arm)   Pulse 97   Temp (!) 101.8 F (38.8 C) (Oral)   Resp 20   SpO2 96%   Physical Exam Vitals and nursing note reviewed.  Constitutional:      General: He is not in acute distress.    Appearance: He is not ill-appearing.  HENT:     Head: Normocephalic and atraumatic.     Comments: No sinus or temporal tenderness.     Right Ear: Tympanic membrane and external ear normal.     Left Ear: Tympanic membrane and external ear normal.     Nose: Nose normal.      Mouth/Throat:     Mouth: Mucous membranes are moist.     Pharynx: Oropharynx is clear.  Eyes:     General: No scleral icterus.       Right eye: No discharge.        Left eye: No discharge.     Extraocular Movements: Extraocular movements intact.     Conjunctiva/sclera: Conjunctivae normal.     Pupils: Pupils are equal, round, and reactive to light.  Neck:     Vascular: No JVD.     Comments: Full ROM intact without spinous process TTP. No bony stepoffs or deformities, no paraspinous muscle TTP or muscle spasms. No rigidity or meningeal signs. No bruising, erythema, or swelling.  Cardiovascular:     Rate and Rhythm: Normal rate and regular rhythm.     Pulses: Normal pulses.          Radial pulses are 2+ on the right side and 2+ on the left side.     Heart sounds: Normal heart sounds.  Pulmonary:     Comments: Lungs clear to auscultation in all fields. Symmetric chest rise. No wheezing, rales, or rhonchi.  Normal work of breathing.  Oxygen saturation is 96% on room air.  Patient is speaking in full sentences.  No respiratory distress. Abdominal:     Comments: Abdomen is soft, non-distended, and non-tender in all quadrants. No rigidity, no guarding. No peritoneal signs.  Musculoskeletal:        General: Normal range of motion.     Cervical back: Normal range of motion.  Skin:    General: Skin is warm and dry.     Capillary Refill: Capillary refill takes less than 2 seconds.  Neurological:     Mental Status: He is oriented to person, place, and time.     GCS: GCS eye subscore is 4. GCS verbal subscore is 5. GCS motor subscore is 6.     Comments: Fluent speech, no facial droop.  Psychiatric:        Behavior: Behavior normal.     ED Results / Procedures / Treatments   Labs (all labs ordered are listed, but only abnormal results are displayed) Labs Reviewed  SARS CORONAVIRUS 2 BY RT PCR (HOSPITAL ORDER, [facility]) - Abnormal; Notable for the  following components:      Result Value   SARS Coronavirus 2 POSITIVE (*)    All other components within normal limits  COMPREHENSIVE METABOLIC PANEL - Abnormal; Notable for the following components:   Glucose, Bld 119 (*)    Calcium 8.5 (*)    AST 44 (*)    ALT 61 (*)    All other components within normal limits  CBC WITH DIFFERENTIAL/PLATELET    EKG None  Radiology DG Chest Portable 1 View  Result Date: 12/06/2019 CLINICAL DATA:  Shortness  of breath EXAM: PORTABLE CHEST 1 VIEW COMPARISON:  None. FINDINGS: Artifact overlies the chest. Heart size is normal. Mediastinal shadows are normal. The lungs are clear. The vascularity is normal. No effusions. No abnormal bone finding. IMPRESSION: No active disease. Electronically Signed   By: Nelson Chimes M.D.   On: 12/06/2019 14:48    Procedures Procedures (including critical care time)  Medications Ordered in ED Medications  acetaminophen (TYLENOL) tablet 500 mg (500 mg Oral Given 12/06/19 1416)  ibuprofen (ADVIL) tablet 600 mg (600 mg Oral Given 12/06/19 1430)  dexamethasone (DECADRON) injection 6 mg (6 mg Intravenous Given 12/06/19 1431)  sodium chloride 0.9 % bolus 1,000 mL (0 mLs Intravenous Stopped 12/06/19 1626)  acetaminophen (TYLENOL) tablet 500 mg (500 mg Oral Given 12/06/19 1640)  sodium chloride 0.9 % bolus 500 mL (0 mLs Intravenous Stopped 12/06/19 1813)    ED Course  I have reviewed the triage vital signs and the nursing notes.  Pertinent labs & imaging results that were available during my care of the patient were reviewed by me and considered in my medical decision making (see chart for details).  Clinical Course as of Dec 06 2234  Thu Dec 06, 2019  1743 Lab down and not crossing into chart.CBC results: WBC: 7.26RBC: 5.53HGB: 14.2HCT: 43.[place]: 26.5MCHC: 33RDW-SD: 43RDW-CV: 14.9Plt: 241Neut: 6.54Lymph: 0.39   [KA]    Clinical Course User Index [KA] Uvaldo Rybacki, Harley Hallmark, PA-C   MDM Rules/Calculators/A&P                           History provided by patient with additional history obtained from chart review.    Patient noted to be febrile in triage to 101, he did not receive Tylenol until he was in the exam room.  He had prolonged wait in the lobby of approximately 6 hours.  On arrival to exam room patient was febrile to 103.1.  He was immediately given Tylenol and ibuprofen and decadron.  He was also given a liter of IV fluids.  Covid test ordered in triage and is positive.  He has normal work of breathing, lungs are clear to auscultation in all fields. He was noted to be tachypneic at times while in the ED, however I have assessed him multiple times and do not appreciate tachypnea. I counted his respiratory rate and it is 24.   I viewed pt's chest xray and it does not suggest acute infectious processes.  Patient ambulated in the emergency department without respiratory distress or hypoxia, SpO2 >95% on room air. Basic labs are unremarkable. Engaged in shared decision making about disposition and patient feels he can manage his symptoms at home. Patient discharged home after discussing strict return precautions. Findings and plan of care discussed with supervising physician Dr. Gilford Raid.  Mitchell Steele was evaluated in Emergency Department on 12/06/2019 for the symptoms described in the history of present illness. He was evaluated in the context of the global COVID-19 pandemic, which necessitated consideration that the patient might be at risk for infection with the SARS-CoV-2 virus that causes COVID-19. Institutional protocols and algorithms that pertain to the evaluation of patients at risk for COVID-19 are in a state of rapid change based on information released by regulatory bodies including the CDC and federal and state organizations. These policies and algorithms were followed during the patient's care in the ED.   Portions of this note were generated with [employer]. Dictation errors may occur despite  best attempts  at proofreading.  Final Clinical Impression(s) / ED Diagnoses Final diagnoses:  COVID-19 virus infection    Rx / DC Orders ED Discharge Orders    None       Flint Melter 12/06/19 2240    Isla Pence, MD 12/11/19 [phone]

## 2019-12-06 NOTE — ED Notes (Signed)
Patient verbalizes understanding of discharge instructions. Opportunity for questioning and answers were provided. Armband removed by staff, pt discharged from ED and ambulated to lobby to return home.   

## 2019-12-06 NOTE — ED Triage Notes (Signed)
Pt reports not feeling well x 3 days with headache, sore throat, abd pain and fever. Denies sob. Reports his sister recently covid + and multiple family members are sick.

## 2019-12-06 NOTE — Discharge Instructions (Addendum)
Thank you for allowing [place] to care for you today.   Please return to the emergency department if you have any new or worsening symptoms.  You tested positive for covid-19 today.   Medications- You can take medications to help treat your symptoms: -Tylenol for fever and body aches. Please take as prescribed on the bottle. -Over the coutner cough medicine such as mucinex, robitussin, or other brands. -Flonase or saline nasal spray for nasal congestion -Vitamins as recommended by CDC- zinc, vitamin B and vitamin C  Treatment- This is a virus and unfortunately there are no antibitotics approved to treat this virus at this time. It is important to monitor your symptoms closely: -You should have a theremometer at home to check your temperature when feeling feverish. -Use a pulse ox meter to measure your oxygen when feeling short of breath.  -If your fever is over 100.4 despite taking tylenol or if your oxygen level drops below 94% these are reasons to rturn to the emergency department for further evaluation. Please call the emergency department before you come to make [place] aware.    We recommend you self-isolate for 10 days and to inform your work/family/friends that you has the virus.  They will need to self-quarantine for 14 days to monitor for symptoms.    Again: symptoms of shortness of breath, chest pain, difficulty breathing, new onset of confusion, any symptoms that are concerning. If any of these symptoms you should come to emergency department for evaluation.   I hope you feel better soon

## 2019-12-07 ENCOUNTER — Telehealth: Payer: Self-pay | Admitting: Oncology

## 2019-12-07 LAB — CBC WITH DIFFERENTIAL/PLATELET
Basophils Absolute: 0 10*3/uL (ref 0.0–0.1)
Basophils Relative: 0 %
Eosinophils Absolute: 0 10*3/uL (ref 0.0–0.5)
Eosinophils Relative: 0 %
HCT: 43 % (ref 39.0–52.0)
Hemoglobin: 14.2 g/dL (ref 13.0–17.0)
Lymphocytes Relative: 5 %
Lymphs Abs: 0.4 10*3/uL — ABNORMAL LOW (ref 0.7–4.0)
MCH: 26.5 pg (ref 26.0–34.0)
MCHC: 33 g/dL (ref 30.0–36.0)
MCV: 80.4 fL (ref 80.0–100.0)
Monocytes Absolute: 0.3 10*3/uL (ref 0.1–1.0)
Monocytes Relative: 4 %
Neutro Abs: 6.5 10*3/uL (ref 1.7–7.7)
Neutrophils Relative %: 91 %
Platelets: 241 10*3/uL (ref 150–400)
RBC: 5.35 MIL/uL (ref 4.22–5.81)
RDW: 14.9 % (ref 11.5–15.5)
WBC: 7.3 10*3/uL (ref 4.0–10.5)

## 2019-12-07 NOTE — Telephone Encounter (Signed)
Called to Discuss with patient about Covid symptoms and the use of regeneron, a monoclonal antibody infusion for those with mild to moderate Covid symptoms and at a high risk of hospitalization.     Pt is qualified for this infusion at the [facility] infusion center due to co-morbid conditions and/or a member of an at-risk group.     Unable to reach pt. unable to leave voicemail.  Cell phone during straight to busy signal.  Will send text message.  Past Medical History:  Diagnosis Date  . Eczema   . Migraine   . Obesity     Rulon Abide, AGNP-C [phone] ([place])

## 2019-12-10 ENCOUNTER — Other Ambulatory Visit: Payer: Self-pay

## 2019-12-10 ENCOUNTER — Emergency Department (HOSPITAL_COMMUNITY): Payer: HRSA Program

## 2019-12-10 ENCOUNTER — Inpatient Hospital Stay (HOSPITAL_COMMUNITY)
Admission: EM | Admit: 2019-12-10 | Discharge: 2019-12-16 | DRG: 177 | Disposition: A | Discharge status: Home / Self Care | Payer: HRSA Program | Attending: Internal Medicine | Admitting: Internal Medicine

## 2019-12-10 ENCOUNTER — Encounter (HOSPITAL_COMMUNITY): Payer: Self-pay | Admitting: Emergency Medicine

## 2019-12-10 DIAGNOSIS — E876 Hypokalemia: Secondary | ICD-10-CM | POA: Diagnosis present

## 2019-12-10 DIAGNOSIS — U071 COVID-19: Principal | ICD-10-CM | POA: Diagnosis present

## 2019-12-10 DIAGNOSIS — E66811 Obesity, class 1: Secondary | ICD-10-CM | POA: Diagnosis present

## 2019-12-10 DIAGNOSIS — F17290 Nicotine dependence, other tobacco product, uncomplicated: Secondary | ICD-10-CM | POA: Diagnosis present

## 2019-12-10 DIAGNOSIS — A0839 Other viral enteritis: Secondary | ICD-10-CM | POA: Diagnosis present

## 2019-12-10 DIAGNOSIS — E861 Hypovolemia: Secondary | ICD-10-CM | POA: Diagnosis present

## 2019-12-10 DIAGNOSIS — Z6841 Body Mass Index (BMI) 40.0 and over, adult: Secondary | ICD-10-CM

## 2019-12-10 DIAGNOSIS — E860 Dehydration: Secondary | ICD-10-CM | POA: Diagnosis present

## 2019-12-10 DIAGNOSIS — Z79899 Other long term (current) drug therapy: Secondary | ICD-10-CM

## 2019-12-10 DIAGNOSIS — E119 Type 2 diabetes mellitus without complications: Secondary | ICD-10-CM

## 2019-12-10 DIAGNOSIS — R0602 Shortness of breath: Secondary | ICD-10-CM | POA: Diagnosis not present

## 2019-12-10 DIAGNOSIS — J1282 Pneumonia due to coronavirus disease 2019: Secondary | ICD-10-CM | POA: Diagnosis present

## 2019-12-10 DIAGNOSIS — E669 Obesity, unspecified: Secondary | ICD-10-CM | POA: Diagnosis present

## 2019-12-10 DIAGNOSIS — Z8249 Family history of ischemic heart disease and other diseases of the circulatory system: Secondary | ICD-10-CM

## 2019-12-10 DIAGNOSIS — E871 Hypo-osmolality and hyponatremia: Secondary | ICD-10-CM

## 2019-12-10 DIAGNOSIS — L309 Dermatitis, unspecified: Secondary | ICD-10-CM | POA: Diagnosis present

## 2019-12-10 DIAGNOSIS — Z833 Family history of diabetes mellitus: Secondary | ICD-10-CM

## 2019-12-10 DIAGNOSIS — G43909 Migraine, unspecified, not intractable, without status migrainosus: Secondary | ICD-10-CM | POA: Diagnosis present

## 2019-12-10 DIAGNOSIS — N179 Acute kidney failure, unspecified: Secondary | ICD-10-CM | POA: Diagnosis present

## 2019-12-10 DIAGNOSIS — J9601 Acute respiratory failure with hypoxia: Secondary | ICD-10-CM | POA: Insufficient documentation

## 2019-12-10 DIAGNOSIS — R112 Nausea with vomiting, unspecified: Secondary | ICD-10-CM | POA: Diagnosis present

## 2019-12-10 DIAGNOSIS — J069 Acute upper respiratory infection, unspecified: Secondary | ICD-10-CM | POA: Diagnosis present

## 2019-12-10 LAB — D-DIMER, QUANTITATIVE: D-Dimer, Quant: 0.74 ug/mL-FEU — ABNORMAL HIGH (ref 0.00–0.50)

## 2019-12-10 LAB — CBC WITH DIFFERENTIAL/PLATELET
Abs Immature Granulocytes: 0.02 10*3/uL (ref 0.00–0.07)
Basophils Absolute: 0 10*3/uL (ref 0.0–0.1)
Basophils Relative: 0 %
Eosinophils Absolute: 0 10*3/uL (ref 0.0–0.5)
Eosinophils Relative: 0 %
HCT: 46.6 % (ref 39.0–52.0)
Hemoglobin: 15.1 g/dL (ref 13.0–17.0)
Immature Granulocytes: 1 %
Lymphocytes Relative: 22 %
Lymphs Abs: 0.8 10*3/uL (ref 0.7–4.0)
MCH: 25.2 pg — ABNORMAL LOW (ref 26.0–34.0)
MCHC: 32.4 g/dL (ref 30.0–36.0)
MCV: 77.8 fL — ABNORMAL LOW (ref 80.0–100.0)
Monocytes Absolute: 0.2 10*3/uL (ref 0.1–1.0)
Monocytes Relative: 6 %
Neutro Abs: 2.6 10*3/uL (ref 1.7–7.7)
Neutrophils Relative %: 71 %
Platelets: 183 10*3/uL (ref 150–400)
RBC: 5.99 MIL/uL — ABNORMAL HIGH (ref 4.22–5.81)
RDW: 14.3 % (ref 11.5–15.5)
WBC: 3.6 10*3/uL — ABNORMAL LOW (ref 4.0–10.5)
nRBC: 0 % (ref 0.0–0.2)

## 2019-12-10 LAB — COMPREHENSIVE METABOLIC PANEL
ALT: 41 U/L (ref 0–44)
AST: 58 U/L — ABNORMAL HIGH (ref 15–41)
Albumin: 3.4 g/dL — ABNORMAL LOW (ref 3.5–5.0)
Alkaline Phosphatase: 42 U/L (ref 38–126)
Anion gap: 11 (ref 5–15)
BUN: 8 mg/dL (ref 6–20)
CO2: 26 mmol/L (ref 22–32)
Calcium: 8.2 mg/dL — ABNORMAL LOW (ref 8.9–10.3)
Chloride: 96 mmol/L — ABNORMAL LOW (ref 98–111)
Creatinine, Ser: 1.21 mg/dL (ref 0.61–1.24)
GFR calc Af Amer: >60 mL/min (ref >60)
GFR calc non Af Amer: >60 mL/min (ref >60)
Glucose, Bld: 123 mg/dL — ABNORMAL HIGH (ref 70–99)
Potassium: 3.2 mmol/L — ABNORMAL LOW (ref 3.5–5.1)
Sodium: 133 mmol/L — ABNORMAL LOW (ref 135–145)
Total Bilirubin: 0.9 mg/dL (ref 0.3–1.2)
Total Protein: 7 g/dL (ref 6.5–8.1)

## 2019-12-10 LAB — TROPONIN I (HIGH SENSITIVITY)
Troponin I (High Sensitivity): 12 ng/L (ref <18)
Troponin I (High Sensitivity): 11 ng/L (ref <18)

## 2019-12-10 LAB — C-REACTIVE PROTEIN: CRP: 4.4 mg/dL — ABNORMAL HIGH (ref <1.0)

## 2019-12-10 LAB — LACTATE DEHYDROGENASE: LDH: 530 U/L — ABNORMAL HIGH (ref 98–192)

## 2019-12-10 LAB — PROCALCITONIN: Procalcitonin: <0.1 ng/mL

## 2019-12-10 LAB — LACTIC ACID, PLASMA: Lactic Acid, Venous: 1.1 mmol/L (ref 0.5–1.9)

## 2019-12-10 LAB — TRIGLYCERIDES: Triglycerides: 172 mg/dL — ABNORMAL HIGH (ref <150)

## 2019-12-10 LAB — FIBRINOGEN: Fibrinogen: 511 mg/dL — ABNORMAL HIGH (ref 210–475)

## 2019-12-10 LAB — FERRITIN: Ferritin: 888 ng/mL — ABNORMAL HIGH (ref 24–336)

## 2019-12-10 MED ORDER — DEXAMETHASONE SODIUM PHOSPHATE 10 MG/ML IJ SOLN
10.0000 mg | Freq: Once | INTRAMUSCULAR | Status: AC
  Administered 2019-12-10: 10 mg via INTRAVENOUS
  Filled 2019-12-10: qty 1

## 2019-12-10 MED ORDER — ACETAMINOPHEN 325 MG PO TABS
650.0000 mg | ORAL_TABLET | Freq: Once | ORAL | Status: AC | PRN
  Administered 2019-12-10: 650 mg via ORAL
  Filled 2019-12-10: qty 2

## 2019-12-10 MED ORDER — IOHEXOL 350 MG/ML SOLN
100.0000 mL | Freq: Once | INTRAVENOUS | Status: AC | PRN
  Administered 2019-12-10: 100 mL via INTRAVENOUS

## 2019-12-10 MED ORDER — SODIUM CHLORIDE 0.9 % IV BOLUS
500.0000 mL | Freq: Once | INTRAVENOUS | Status: AC
  Administered 2019-12-10: 500 mL via INTRAVENOUS

## 2019-12-10 NOTE — ED Provider Notes (Signed)
8:56 PM Care assumed from [facility] while awaiting results of labs prior to admission for hypoxia in the setting of COVID-19 infection.  Patient has new oxygen requirement of 2 L but dropped precipitously to the low 80s and was very symptomatic when he tried to ambulate without oxygen support.  After labs are completed, will admit for further management.  If dimer is elevated, will order a CT PE study.  9:45 PM CT scan will be ordered to rule out PE given elevated D-dimer.  Will be atmitted after.    11:59 PM PE study negative. Will admit for hypoxia with covid.    Clinical Impression: 1. Pneumonia due to COVID-19 virus   2. Acute respiratory failure with hypoxia (HCC)     Disposition: Admit  This note was prepared with assistance of Dragon voice recognition software. Occasional wrong-word or sound-a-like substitutions may have occurred due to the inherent limitations of voice recognition software.        Alando Colleran, Gwenyth Allegra, MD 12/11/19 0000

## 2019-12-10 NOTE — ED Provider Notes (Signed)
[place] EMERGENCY DEPARTMENT Provider Note   CSN: [id number] Arrival date & time: 12/10/19  1723     History Chief Complaint  Patient presents with  . Shortness of Breath    Mitchell Steele is a 24 y.o. male with history of eczema, obesity, migraine headaches presents for evaluation of worsening shortness of breath, fevers, fatigue.  Developed Covid symptoms 6 days ago and was seen on 12/06/2019 in the ED for evaluation where he tested positive for Covid.  Reports multiple family members at home have tested positive as well.  Today he developed multiple episodes of nonbloody nonbilious emesis.  Has not been able to tolerate much p.o. intake since then.  He feels lightheaded with standing but denies syncope.  Endorses burning midline pain that radiates up to the throat that began today.  Denies pain with deep inspiration.  He has been taking NyQuil for symptoms at home.  States he has been having difficulty keeping his fever down at home.  He smokes Black and milds but states he has not been smoking since his symptoms began.    The history is provided by the patient.       Past Medical History:  Diagnosis Date  . Eczema   . Migraine   . Obesity     Patient Active Problem List   Diagnosis Date Noted  . Chronic tension headaches 02/27/2014  . Cerumen impaction 02/27/2014  . Gastroenteritis 05/28/2013  . Exposure to STD 07/04/2012  . Constipation 04/03/2012  . Screening for STD (sexually transmitted disease) 04/03/2012  . Suicide attempt ([place]) 02/08/2012  . Depression 02/08/2012  . Anxiety 02/08/2012  . 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 Allergic rhinitis, seasonal 02/19/2011  . MIGRAINE HEADACHE 04/10/2010  . ECZEMA 03/21/2009  . 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 No pertinent surgical history.     Family History  Problem Relation Age of Onset  . Diabetes Mother   . Diabetes Maternal Grandmother   . Hypertension Maternal  Grandmother   . Heart attack Maternal Grandmother   . Heart disease Maternal Grandmother   . Alcohol abuse Maternal Uncle     Social History   Tobacco Use  . Smoking status: Current Every Day Smoker    Packs/day: 0.00    Types: Cigars, Cigarettes  . Smokeless tobacco: Never Used  Substance Use Topics  . Alcohol use: No    Alcohol/week: 0.0 standard drinks  . Drug use: No    Home Medications Prior to Admission medications   Medication Sig Start Date End Date Taking? Authorizing Provider  amoxicillin-clavulanate (AUGMENTIN) 875-125 MG tablet Take 1 tablet by mouth every 12 (twelve) hours. 05/19/16   Forde Dandy, MD  azithromycin (ZITHROMAX Z-PAK) 250 MG tablet Take as directed on pack 12/15/15   Billy Fischer, MD  benzonatate (TESSALON) 100 MG capsule Take 1 capsule (100 mg total) by mouth 3 (three) times daily as needed for cough. 04/01/17   Petrucelli, Samantha R, PA-C  fluticasone (FLONASE) 50 MCG/ACT nasal spray Place 1 spray into both nostrils daily. 04/01/17   Petrucelli, Samantha R, PA-C  ibuprofen (ADVIL,MOTRIN) 800 MG tablet Take 1 tablet (800 mg total) by mouth 3 (three) times daily. 04/01/17   Petrucelli, Samantha R, PA-C  ipratropium (ATROVENT) 0.06 % nasal spray Place 2 sprays into both nostrils 4 (four) times daily. 12/15/15   Billy Fischer, MD  prochlorperazine (COMPAZINE) 10 MG tablet Take 1 tablet (10 mg total) by mouth 2 (  two) times daily as needed for nausea or vomiting. 05/19/16   Forde Dandy, MD    Allergies    Patient has no known allergies.  Review of Systems   Review of Systems  Constitutional: Positive for chills, fatigue and fever.  Respiratory: Positive for cough and shortness of breath.   Cardiovascular: Positive for chest pain.  Gastrointestinal: Positive for nausea and vomiting. Negative for abdominal pain.  All other systems reviewed and are negative.   Physical Exam Updated Vital Signs BP 135/75   Pulse (!) 108   Temp (!) 102.6 F (39.2  C) (Oral)   Resp (!) 36   SpO2 96%   Physical Exam Vitals and nursing note reviewed.  Constitutional:      General: He is not in acute distress.    Appearance: He is well-developed. He is obese. He is ill-appearing.  HENT:     Head: Normocephalic and atraumatic.  Eyes:     General:        Right eye: No discharge.        Left eye: No discharge.     Conjunctiva/sclera: Conjunctivae normal.  Neck:     Vascular: No JVD.     Trachea: No tracheal deviation.  Cardiovascular:     Rate and Rhythm: Regular rhythm. Tachycardia present.  Pulmonary:     Effort: Tachypnea present.     Comments: Tachypneic up to 40 respirations per minute.  Speaking in shorter phrases.  SPO2 saturations 90% at rest, dropped to 88% with movement.  Improved on 2 L supplemental oxygen via nasal cannula Abdominal:     General: Bowel sounds are normal. There is no distension.     Palpations: Abdomen is soft.     Tenderness: There is no abdominal tenderness. There is no guarding or rebound.  Musculoskeletal:     Right lower leg: No tenderness. No edema.     Left lower leg: No tenderness. No edema.  Skin:    General: Skin is warm.     Findings: No erythema.  Neurological:     Mental Status: He is alert.  Psychiatric:        Behavior: Behavior normal.     ED Results / Procedures / Treatments   Labs (all labs ordered are listed, but only abnormal results are displayed) Labs Reviewed  CBC WITH DIFFERENTIAL/PLATELET - Abnormal; Notable for the following components:      Result Value   WBC 3.6 (*)    RBC 5.99 (*)    MCV 77.8 (*)    MCH 25.2 (*)    All other components within normal limits  CULTURE, BLOOD (ROUTINE X 2)  CULTURE, BLOOD (ROUTINE X 2)  LACTIC ACID, PLASMA  LACTIC ACID, PLASMA  COMPREHENSIVE METABOLIC PANEL  D-DIMER, QUANTITATIVE (NOT AT [facility])  PROCALCITONIN  LACTATE DEHYDROGENASE  FERRITIN  TRIGLYCERIDES  FIBRINOGEN  C-REACTIVE PROTEIN  TROPONIN I (HIGH SENSITIVITY)    EKG EKG  Interpretation  Date/Time:  Monday December 10 2019 17:35:51 EDT Ventricular Rate:  123 PR Interval:  132 QRS Duration: 74 QT Interval:  330 QTC Calculation: 472 R Axis:   101 Text Interpretation: Sinus tachycardia Rightward axis Anterior infarct , age undetermined T wave abnormality, consider inferior ischemia Abnormal ECG When compared to prior, faster rate. No STEMI Confirmed by Antony Blackbird [phone]) on 12/10/2019 6:55:28 PM   Radiology DG Chest Portable 1 View  Result Date: 12/10/2019 CLINICAL DATA:  SOB/COVID+(09/02); patient unvaccinated. Smoker. EXAM: PORTABLE CHEST 1 VIEW COMPARISON:  12/06/2019  FINDINGS: Hazy bilateral airspace lung opacities have developed since the prior chest radiograph, predominantly in the lung bases and mid lungs. No evidence of pulmonary edema. No pleural effusion or pneumothorax. Cardiac silhouette normal in size.  No mediastinal or hilar masses. Skeletal structures are grossly intact. IMPRESSION: 1. Hazy bilateral airspace lung opacities consistent with multifocal COVID-19 pneumonia, developing since previous chest radiograph. Electronically Signed   By: Lajean Manes M.D.   On: 12/10/2019 18:10    Procedures .Critical Care Performed by: Renita Papa, PA-C Authorized by: Renita Papa, PA-C   Critical care provider statement:    Critical care time (minutes):  45   Critical care was necessary to treat or prevent imminent or life-threatening deterioration of the following conditions:  Respiratory failure   Critical care was time spent personally by me on the following activities:  Discussions with consultants, evaluation of patient's response to treatment, examination of patient, ordering and performing treatments and interventions, ordering and review of laboratory studies, ordering and review of radiographic studies, pulse oximetry, re-evaluation of patient's condition, obtaining history from patient or surrogate and review of old charts   (including  critical care time)  Medications Ordered in ED Medications  acetaminophen (TYLENOL) tablet 650 mg (650 mg Oral Given 12/10/19 1740)  sodium chloride 0.9 % bolus 500 mL (500 mLs Intravenous New Bag/Given 12/10/19 1957)  dexamethasone (DECADRON) injection 10 mg (10 mg Intravenous Given 12/10/19 1958)    ED Course  I have reviewed the triage vital signs and the nursing notes.  Pertinent labs & imaging results that were available during my care of the patient were reviewed by me and considered in my medical decision making (see chart for details).    MDM Rules/Calculators/A&P                          Mitchell Steele was evaluated in Emergency Department on 12/10/2019 for the symptoms described in the history of present illness. He was evaluated in the context of the global COVID-19 pandemic, which necessitated consideration that the patient might be at risk for infection with the SARS-CoV-2 virus that causes COVID-19. Institutional protocols and algorithms that pertain to the evaluation of patients at risk for COVID-19 are in a state of rapid change based on information released by regulatory bodies including the CDC and federal and state organizations. These policies and algorithms were followed during the patient's care in the ED.  Patient presenting for evaluation of worsening shortness of breath, persistent fevers, now developed nausea and vomiting today as well as lightheadedness with standing.  Patient is febrile in the ED, tachycardic and tachypneic.  SPO2 saturations in the low 90s at rest and drops into the 80s with ambulation.  He is requiring 2 L supplemental oxygen via nasal cannula at this time.  He becomes tachypneic with a respiratory rate above 40 and tachycardic with a heart rate in the 130s with ambulation.  At this time low suspicion of PE in the absence of pleuritic chest pain though Covid is a risk factor.  More so feel that his tachycardia and tachypnea are in the setting of dehydration  as well as fever.  He was given Tylenol for fever.  Also given IV fluids and Decadron.  Chest x-ray shows interval development of Covid pneumonia.  8:56 PM Care signed out to Dr. Sherry Ruffing to follow up on CMP.  Anticipate lab work will be consistent with COVID-19 infection.  Patient will require admission  to the hospital for further evaluation and management of his COVID-19 infection with new pneumonia and oxygen requirement. Final Clinical Impression(s) / ED Diagnoses Final diagnoses:  Pneumonia due to COVID-19 virus  Acute respiratory failure with hypoxia [facility])    Rx / DC Orders ED Discharge Orders    None       Debroah Baller 12/10/19 2057    Tegeler, Gwenyth Allegra, MD 12/11/19 [phone]

## 2019-12-10 NOTE — ED Triage Notes (Signed)
Pt diagnosed COVID + on 9/2, reports increased shortness of breath, fevers and nausea.

## 2019-12-11 DIAGNOSIS — E66811 Obesity, class 1: Secondary | ICD-10-CM | POA: Diagnosis present

## 2019-12-11 DIAGNOSIS — R0602 Shortness of breath: Secondary | ICD-10-CM | POA: Diagnosis present

## 2019-12-11 DIAGNOSIS — E861 Hypovolemia: Secondary | ICD-10-CM | POA: Diagnosis present

## 2019-12-11 DIAGNOSIS — F17290 Nicotine dependence, other tobacco product, uncomplicated: Secondary | ICD-10-CM | POA: Diagnosis present

## 2019-12-11 DIAGNOSIS — A0839 Other viral enteritis: Secondary | ICD-10-CM | POA: Diagnosis present

## 2019-12-11 DIAGNOSIS — N179 Acute kidney failure, unspecified: Secondary | ICD-10-CM | POA: Diagnosis present

## 2019-12-11 DIAGNOSIS — E876 Hypokalemia: Secondary | ICD-10-CM

## 2019-12-11 DIAGNOSIS — Z79899 Other long term (current) drug therapy: Secondary | ICD-10-CM | POA: Diagnosis not present

## 2019-12-11 DIAGNOSIS — J9601 Acute respiratory failure with hypoxia: Secondary | ICD-10-CM

## 2019-12-11 DIAGNOSIS — G43909 Migraine, unspecified, not intractable, without status migrainosus: Secondary | ICD-10-CM | POA: Diagnosis present

## 2019-12-11 DIAGNOSIS — L309 Dermatitis, unspecified: Secondary | ICD-10-CM | POA: Diagnosis present

## 2019-12-11 DIAGNOSIS — Z833 Family history of diabetes mellitus: Secondary | ICD-10-CM | POA: Diagnosis not present

## 2019-12-11 DIAGNOSIS — E860 Dehydration: Secondary | ICD-10-CM | POA: Diagnosis present

## 2019-12-11 DIAGNOSIS — Z6841 Body Mass Index (BMI) 40.0 and over, adult: Secondary | ICD-10-CM | POA: Diagnosis not present

## 2019-12-11 DIAGNOSIS — E669 Obesity, unspecified: Secondary | ICD-10-CM | POA: Diagnosis not present

## 2019-12-11 DIAGNOSIS — J1282 Pneumonia due to coronavirus disease 2019: Secondary | ICD-10-CM

## 2019-12-11 DIAGNOSIS — E119 Type 2 diabetes mellitus without complications: Secondary | ICD-10-CM | POA: Diagnosis present

## 2019-12-11 DIAGNOSIS — J069 Acute upper respiratory infection, unspecified: Secondary | ICD-10-CM | POA: Diagnosis not present

## 2019-12-11 DIAGNOSIS — U071 COVID-19: Secondary | ICD-10-CM | POA: Diagnosis present

## 2019-12-11 DIAGNOSIS — E871 Hypo-osmolality and hyponatremia: Secondary | ICD-10-CM | POA: Diagnosis present

## 2019-12-11 DIAGNOSIS — R112 Nausea with vomiting, unspecified: Secondary | ICD-10-CM | POA: Diagnosis present

## 2019-12-11 DIAGNOSIS — Z8249 Family history of ischemic heart disease and other diseases of the circulatory system: Secondary | ICD-10-CM | POA: Diagnosis not present

## 2019-12-11 LAB — HIV ANTIBODY (ROUTINE TESTING W REFLEX): HIV Screen 4th Generation wRfx: NONREACTIVE

## 2019-12-11 LAB — MAGNESIUM: Magnesium: 2.1 mg/dL (ref 1.7–2.4)

## 2019-12-11 LAB — BLOOD CULTURE ID PANEL (REFLEXED) - BCID2

## 2019-12-11 LAB — LIPID PANEL
Cholesterol: 117 mg/dL (ref 0–200)
HDL: 22 mg/dL — ABNORMAL LOW (ref >40)
LDL Cholesterol: 67 mg/dL (ref 0–99)
Total CHOL/HDL Ratio: 5.3 RATIO
Triglycerides: 138 mg/dL (ref <150)
VLDL: 28 mg/dL (ref 0–40)

## 2019-12-11 LAB — CBG MONITORING, ED
Glucose-Capillary: 171 mg/dL — ABNORMAL HIGH (ref 70–99)
Glucose-Capillary: 186 mg/dL — ABNORMAL HIGH (ref 70–99)

## 2019-12-11 MED ORDER — HYDROCODONE-ACETAMINOPHEN 5-325 MG PO TABS: 1.0000 | ORAL_TABLET | ORAL | Status: DC | PRN

## 2019-12-11 MED ORDER — METHYLPREDNISOLONE SODIUM SUCC 500 MG IJ SOLR: 0.5000 mg/kg | Freq: Two times a day (BID) | INTRAMUSCULAR | Status: DC

## 2019-12-11 MED ORDER — ACETAMINOPHEN 325 MG PO TABS
650.0000 mg | ORAL_TABLET | Freq: Four times a day (QID) | ORAL | Status: DC | PRN
  Administered 2019-12-12: 650 mg via ORAL
  Filled 2019-12-11: qty 2

## 2019-12-11 MED ORDER — SODIUM CHLORIDE 0.9 % IV SOLN: INTRAVENOUS | Status: DC

## 2019-12-11 MED ORDER — INSULIN ASPART 100 UNIT/ML ~~LOC~~ SOLN: 0.0000 [IU] | Freq: Three times a day (TID) | SUBCUTANEOUS | Status: DC

## 2019-12-11 MED ORDER — SODIUM CHLORIDE 0.9 % IV SOLN
200.0000 mg | Freq: Once | INTRAVENOUS | Status: AC
  Administered 2019-12-11: 200 mg via INTRAVENOUS
  Filled 2019-12-11: qty 40

## 2019-12-11 MED ORDER — POTASSIUM CHLORIDE 10 MEQ/100ML IV SOLN
10.0000 meq | INTRAVENOUS | Status: AC
  Administered 2019-12-11 (×4): 10 meq via INTRAVENOUS
  Filled 2019-12-11 (×4): qty 100

## 2019-12-11 MED ORDER — SODIUM CHLORIDE 0.9% FLUSH
3.0000 mL | Freq: Two times a day (BID) | INTRAVENOUS | Status: DC
  Administered 2019-12-11 – 2019-12-16 (×10): 3 mL via INTRAVENOUS

## 2019-12-11 MED ORDER — METHYLPREDNISOLONE SODIUM SUCC 125 MG IJ SOLR
60.0000 mg | Freq: Two times a day (BID) | INTRAMUSCULAR | Status: DC
  Administered 2019-12-11 – 2019-12-15 (×8): 60 mg via INTRAVENOUS
  Filled 2019-12-11 (×8): qty 2

## 2019-12-11 MED ORDER — PREDNISONE 20 MG PO TABS: 50.0000 mg | ORAL_TABLET | Freq: Every day | ORAL | Status: DC

## 2019-12-11 MED ORDER — ZINC SULFATE 220 (50 ZN) MG PO CAPS
220.0000 mg | ORAL_CAPSULE | Freq: Every day | ORAL | Status: DC
  Administered 2019-12-11 – 2019-12-16 (×6): 220 mg via ORAL
  Filled 2019-12-11 (×6): qty 1

## 2019-12-11 MED ORDER — ENOXAPARIN SODIUM 40 MG/0.4ML ~~LOC~~ SOLN
40.0000 mg | Freq: Every day | SUBCUTANEOUS | Status: DC
  Administered 2019-12-11: 40 mg via SUBCUTANEOUS
  Filled 2019-12-11: qty 0.4

## 2019-12-11 MED ORDER — ASCORBIC ACID 500 MG PO TABS
500.0000 mg | ORAL_TABLET | Freq: Every day | ORAL | Status: DC
  Administered 2019-12-11 – 2019-12-16 (×6): 500 mg via ORAL
  Filled 2019-12-11 (×6): qty 1

## 2019-12-11 MED ORDER — METHYLPREDNISOLONE SODIUM SUCC 125 MG IJ SOLR
60.0000 mg | Freq: Two times a day (BID) | INTRAMUSCULAR | Status: DC
  Administered 2019-12-11: 60 mg via INTRAVENOUS
  Filled 2019-12-11: qty 2

## 2019-12-11 MED ORDER — GUAIFENESIN-DM 100-10 MG/5ML PO SYRP
10.0000 mL | ORAL_SOLUTION | ORAL | Status: DC | PRN
  Administered 2019-12-13: 10 mL via ORAL
  Filled 2019-12-11: qty 10

## 2019-12-11 MED ORDER — ENOXAPARIN SODIUM 60 MG/0.6ML ~~LOC~~ SOLN
58.0000 mg | Freq: Every day | SUBCUTANEOUS | Status: DC
  Administered 2019-12-12 – 2019-12-13 (×2): 58 mg via SUBCUTANEOUS
  Filled 2019-12-11: qty 0.57
  Filled 2019-12-11: qty 0.6

## 2019-12-11 MED ORDER — FAMOTIDINE 20 MG PO TABS
20.0000 mg | ORAL_TABLET | Freq: Every day | ORAL | Status: DC
  Administered 2019-12-11 – 2019-12-16 (×6): 20 mg via ORAL
  Filled 2019-12-11 (×6): qty 1

## 2019-12-11 MED ORDER — ONDANSETRON HCL 4 MG PO TABS: 4.0000 mg | ORAL_TABLET | Freq: Four times a day (QID) | ORAL | Status: DC | PRN

## 2019-12-11 MED ORDER — SODIUM CHLORIDE 0.9 % IV SOLN
100.0000 mg | Freq: Every day | INTRAVENOUS | Status: AC
  Administered 2019-12-12 – 2019-12-15 (×4): 100 mg via INTRAVENOUS
  Filled 2019-12-11 (×5): qty 20

## 2019-12-11 MED ORDER — ONDANSETRON HCL 4 MG/2ML IJ SOLN: 4.0000 mg | Freq: Four times a day (QID) | INTRAMUSCULAR | Status: DC | PRN

## 2019-12-11 NOTE — Progress Notes (Signed)
Patient admitted earlier this morning.  H&P reviewed.  Patient seen and examined.  Patient states that he is feeling about the same as yesterday.  Continues to have some difficulty breathing with occasional dry cough.  No chest pain.  No nausea vomiting.  He is noted to be on 3 L of oxygen at this time saturating in the early 90s.  Was febrile last night.  Temperature is 99.5 this morning.  Other vital signs are stable.  Lungs reveal crackles bilateral bases.  No wheezing or rhonchi.  Mildly tachypneic.  No use of accessory muscles. S1-S2 is normal regular.  No S3-S4.  No rubs or bruit Abdomen is soft.  Nontender nondistended.  Magnesium noted to be 2.1.  Patient CRP was 4.4.  Procalcitonin less than 0.1.  D-dimer was 0.74.  CT angiogram did not show any PE but did show multiple opacities.  Patient admitted to the hospital with pneumonia due to COVID-19.  Also has acute respiratory failure with hypoxia.  He is obese.  Continue with the Remdesivir and steroids.  Incentive spirometry and mobilization.  If his oxygen requirements continue to climb we may have to consider baricitinib or Actemra.  This was discussed with the patient and is agreeable.  Continue to trend blood counts.  Potassium was noted to be low yesterday.  Looks like this was supplemented.  We will recheck it tomorrow.  Magnesium is normal.  Weight-based DVT prophylaxis.  We will continue to follow.  Bonnielee Haff 12/11/2019

## 2019-12-11 NOTE — H&P (Signed)
Mitchell Steele YWV:[id number] DOB: [birth year] DOA: 12/10/2019     PCP: Patient, No Pcp Per   Outpatient Specialists:  NONE    Patient arrived to ER on 12/10/19 at 1723 Referred by Attending Tegeler, Gwenyth Allegra, *   Patient coming from: home Lives  With family    Chief Complaint:  Chief Complaint  Patient presents with  . Shortness of Breath    HPI: Mitchell Steele is a 24 y.o. male with medical history significant of obesity, Eczema, migraines,     Presented with shortness of breath and fever Originally seen in the emergency department on 2 September with sore throat headache generalized fatigue.  Tested positive for Covid At the time was able to be discharged to home with plan to have MAB given But patient was unable to be reached Came in today with increased shortness of breath fever nausea He has had profuse nausea and vomiting feeling fairly lightheaded when he stands up although never passed out.  He has been having burning chest pain Try to take NyQuil but does not seem to help. Patient works at [employer] he sees a lot of people during his workday Reports his mother is so they said positive for Covid but she is vaccinated and been doing better  Infectious risk factors:  Reports  fever, shortness of breath, dry cough,   N/V/Diarrhea/abdominal pain,  Body aches, severe fatigue    KNOWN COVID POSITIVE   Has  NOt been vaccinated against COVID      Lab Results  Component Value Date   [place] (A) 12/06/2019    Regarding pertinent Chronic problems:    obesity-   BMI Readings from Last 1 Encounters:  12/11/19 41.60 kg/m    While in ER:  Chest x-ray showed Covid pneumonia While in ER developed new oxygen requirement Patient oxygenation dropped precipitously in the low 80s when trying to ambulate  Hospitalist was called for admission for Pneumonia secondary to Covid with acute respiratory failure hypoxia   The following Work up has  been ordered so far:  Orders Placed This Encounter  Procedures  . Critical Care  . Blood Culture (routine x 2)  . DG Chest Portable 1 View  . CT Angio Chest PE W and/or Wo Contrast  . Lactic acid, plasma  . CBC WITH DIFFERENTIAL  . Comprehensive metabolic panel  . D-dimer, quantitative  . Procalcitonin  . Lactate dehydrogenase  . Ferritin  . Fibrinogen  . C-reactive protein  . Triglycerides  . Diet NPO time specified  . Cardiac monitoring  . Insert peripheral IV x 2  . Initiate Carrier Fluid Protocol  . Place surgical mask on patient  . Patient to wear surgical mask during transportation  . Assess patient for ability to self-prone. If able (can move self in bed, ambulate) and stable (SpO2 and oxygen requirement):  . RN/NT - Document specific oxygen requirements in CHL  . Notify EDP if new oxygen requirements escalates > 4L per minute [place]  . RN to draw the following extra tubes:  . Consult for [place] Admission  ALL PATIENTS BEING ADMITTED/HAVING PROCEDURES NEED COVID-19 SCREENING  . Airborne and Contact precautions  . Pulse oximetry, continuous  . EKG 12-Lead    Following Medications were ordered in ER: Medications  acetaminophen (TYLENOL) tablet 650 mg (650 mg Oral Given 12/10/19 1740)  sodium chloride 0.9 % bolus 500 mL (0 mLs Intravenous Stopped 12/10/19 2305)  dexamethasone (DECADRON) injection 10 mg (10 mg Intravenous  Given 12/10/19 1958)  iohexol (OMNIPAQUE) 350 MG/ML injection 100 mL (100 mLs Intravenous Contrast Given 12/10/19 2255)     Significant initial  Findings: Abnormal Labs Reviewed  CBC WITH DIFFERENTIAL/PLATELET - Abnormal; Notable for the following components:      Result Value   WBC 3.6 (*)    RBC 5.99 (*)    MCV 77.8 (*)    MCH 25.2 (*)    All other components within normal limits  COMPREHENSIVE METABOLIC PANEL - Abnormal; Notable for the following components:   Sodium 133 (*)    Potassium 3.2 (*)    Chloride 96 (*)    Glucose, Bld 123 (*)      Calcium 8.2 (*)    Albumin 3.4 (*)    AST 58 (*)    All other components within normal limits  D-DIMER, QUANTITATIVE (NOT AT [facility]) - Abnormal; Notable for the following components:   D-Dimer, Quant 0.74 (*)    All other components within normal limits  LACTATE DEHYDROGENASE - Abnormal; Notable for the following components:   LDH 530 (*)    All other components within normal limits  FERRITIN - Abnormal; Notable for the following components:   Ferritin 888 (*)    All other components within normal limits  FIBRINOGEN - Abnormal; Notable for the following components:   Fibrinogen 511 (*)    All other components within normal limits  C-REACTIVE PROTEIN - Abnormal; Notable for the following components:   CRP 4.4 (*)    All other components within normal limits  TRIGLYCERIDES - Abnormal; Notable for the following components:   Triglycerides 172 (*)    All other components within normal limits    Otherwise labs showing:    Recent Labs  Lab 12/06/19 1612 12/10/19 1928  NA 137 133*  K 4.2 3.2*  CO2 24 26  GLUCOSE 119* 123*  BUN 9 8  CREATININE 0.92 1.21  CALCIUM 8.5* 8.2*    Cr    Up from baseline see below Lab Results  Component Value Date   CREATININE 1.21 12/10/2019   CREATININE 0.92 12/06/2019   CREATININE 0.98 02/04/2012    Recent Labs  Lab 12/06/19 1612 12/10/19 1928  AST 44* 58*  ALT 61* 41  ALKPHOS 62 42  BILITOT 0.7 0.9  PROT 7.2 7.0  ALBUMIN 3.7 3.4*   Lab Results  Component Value Date   CALCIUM 8.2 (L) 12/10/2019      WBC      Component Value Date/Time   WBC 3.6 (L) 12/10/2019 1928   ANC    Component Value Date/Time   NEUTROABS 2.6 12/10/2019 1928   ALC 0.8  Plt: Lab Results  Component Value Date   PLT 183 12/10/2019   Lactic Acid, Venous    Component Value Date/Time   LATICACIDVEN 1.1 12/10/2019 1950    Procalcitonin <0.1   COVID-19 Labs  Recent Labs    12/10/19 1928  DDIMER 0.74*  FERRITIN 888*  LDH 530*  CRP 4.4*     Lab Results  Component Value Date   SARSCOV2NAA POSITIVE (A) 12/06/2019    HG/HCT  Stable,     Component Value Date/Time   HGB 15.1 12/10/2019 1928   HCT 46.6 12/10/2019 1928    Troponin 12- 11   ECG: Ordered Personally reviewed by me showing: HR :  123 Rhythm:  Sinus tachycardia     no evidence of ischemic changes QTC 472    UA not ordered  Ordered  CXR -bilateral infiltrates   CTA chest -   no PE,  evidence of infiltrate     ED Triage Vitals [12/10/19 1736]  Enc Vitals Group     BP 115/71     Pulse Rate (!) 126     Resp (!) 22     Temp (!) 102.8 F (39.3 C)     Temp Source Oral     SpO2 90 %     Weight      Height      Head Circumference      Peak Flow      Pain Score 0     Pain Loc      Pain Edu?      Excl. in GC?   TMAX(24)@       Latest  Blood pressure (!) 144/87, pulse (!) 105, temperature (!) 102.6 F (39.2 C), temperature source Oral, resp. rate (!) 44, SpO2 94 %.      Review of Systems:    Pertinent positives include:   Fevers, chills, fatigue  abdominal pain, nausea, vomiting, diarrhea,  Constitutional:  No weight loss, night sweats, , weight loss  HEENT:  No headaches, Difficulty swallowing,Tooth/dental problems,Sore throat,  No sneezing, itching, ear ache, nasal congestion, post nasal drip,  Cardio-vascular:  No chest pain, Orthopnea, PND, anasarca, dizziness, palpitations.no Bilateral lower extremity swelling  GI:  No heartburn, indigestion, change in bowel habits, loss of appetite, melena, blood in stool, hematemesis Resp:  no shortness of breath at rest. No dyspnea on exertion, No excess mucus, no productive cough, No non-productive cough, No coughing up of blood.No change in color of mucus.No wheezing. Skin:  no rash or lesions. No jaundice GU:  no dysuria, change in color of urine, no urgency or frequency. No straining to urinate.  No flank pain.  Musculoskeletal:  No joint pain or no joint swelling. No decreased range  of motion. No back pain.  Psych:  No change in mood or affect. No depression or anxiety. No memory loss.  Neuro: no localizing neurological complaints, no tingling, no weakness, no double vision, no gait abnormality, no slurred speech, no confusion  All systems reviewed and apart from HOPI all are negative  Past Medical History:   Past Medical History:  Diagnosis Date  . Eczema   . Migraine   . Obesity       Social History:  Ambulatory   independently       reports that he has been smoking cigars and cigarettes. He has been smoking about 0.00 packs per day. He has never used smokeless tobacco. He reports that he does not drink alcohol and does not use drugs.   Family History:   Family History  Problem Relation Age of Onset  . Diabetes Mother   . Diabetes Maternal Grandmother   . Hypertension Maternal Grandmother   . Heart attack Maternal Grandmother   . Heart disease Maternal Grandmother   . Alcohol abuse Maternal Uncle     Allergies: No Known Allergies   Prior to Admission medications   Medication Sig Start Date End Date Taking? Authorizing Provider  amoxicillin-clavulanate (AUGMENTIN) 875-125 MG tablet Take 1 tablet by mouth every 12 (twelve) hours. 05/19/16   Liu, Dana Duo, MD  azithromycin (ZITHROMAX Z-PAK) 250 MG tablet Take as directed on pack 12/15/15   Kindl, James D, MD  benzonatate (TESSALON) 100 MG capsule Take 1 capsule (100 mg total) by mouth 3 (three) times daily as needed for cough. 04/01/17     Petrucelli, Samantha R, PA-C  fluticasone (FLONASE) 50 MCG/ACT nasal spray Place 1 spray into both nostrils daily. 04/01/17   Petrucelli, Samantha R, PA-C  ibuprofen (ADVIL,MOTRIN) 800 MG tablet Take 1 tablet (800 mg total) by mouth 3 (three) times daily. 04/01/17   Petrucelli, Samantha R, PA-C  ipratropium (ATROVENT) 0.06 % nasal spray Place 2 sprays into both nostrils 4 (four) times daily. 12/15/15   Kindl, James D, MD  prochlorperazine (COMPAZINE) 10 MG tablet Take  1 tablet (10 mg total) by mouth 2 (two) times daily as needed for nausea or vomiting. 05/19/16   Liu, Dana Duo, MD   Physical Exam: Vitals with BMI 12/10/2019 12/10/2019 12/10/2019  Height - - -  Weight - - -  BMI - - -  Systolic 144 - 135  Diastolic 87 - 75  Pulse 105 108 -  Some encounter information is confidential and restricted. Go to Review Flowsheets activity to see all data.     1. General:  in No  Acute distress   toxic acutely ill -appearing 2. Psychological: Alert and  Oriented 3. Head/ENT:    Dry Mucous Membranes                          Head Non traumatic, neck supple                          Normal  Dentition 4. SKIN:  decreased Skin turgor,  Skin clean Dry and intact no rash 5. Heart: Regular rate and rhythm no  Murmur, no Rub or gallop 6. Lungs Clear to auscultation bilaterally, no wheezes or crackles   7. Abdomen: Soft, non-tender, Non distended  bowel sounds present 8. Lower extremities: no clubbing, cyanosis, no  edema 9. Neurologically Grossly intact, moving all 4 extremities equally  10. MSK: Normal range of motion   All other LABS:     Recent Labs  Lab 12/06/19 1612 12/10/19 1928  WBC 7.3 3.6*  NEUTROABS 6.5 2.6  HGB 14.2 15.1  HCT 43.0 46.6  MCV 80.4 77.8*  PLT 241 183     Recent Labs  Lab 12/06/19 1612 12/10/19 1928  NA 137 133*  K 4.2 3.2*  CL 104 96*  CO2 24 26  GLUCOSE 119* 123*  BUN 9 8  CREATININE 0.92 1.21  CALCIUM 8.5* 8.2*     Recent Labs  Lab 12/06/19 1612 12/10/19 1928  AST 44* 58*  ALT 61* 41  ALKPHOS 62 42  BILITOT 0.7 0.9  PROT 7.2 7.0  ALBUMIN 3.7 3.4*       Cultures:    Component Value Date/Time   SDES THROAT 05/19/2016 1229   SPECREQUEST NONE Reflexed from [id number] 05/19/2016 1229   CULT NO GROUP A STREP (S.PYOGENES) ISOLATED 05/19/2016 1229   REPTSTATUS 05/22/2016 FINAL 05/19/2016 1229     Radiological Exams on Admission: CT Angio Chest PE W and/or Wo Contrast  Result Date: 12/10/2019 CLINICAL DATA:  PE  suspected, low/intermediate prob, positive D-dimer COVID positive with increasing shortness of breath. EXAM: CT ANGIOGRAPHY CHEST WITH CONTRAST TECHNIQUE: Multidetector CT imaging of the chest was performed using the standard protocol during bolus administration of intravenous contrast. Multiplanar CT image reconstructions and MIPs were obtained to evaluate the vascular anatomy. CONTRAST:  73mL OMNIPAQUE IOHEXOL 350 MG/ML SOLN COMPARISON:  Radiograph earlier this day. FINDINGS: Cardiovascular: There are no filling defects within the pulmonary arteries to suggest pulmonary embolus. The thoracic   aorta is normal in caliber. No aortic dissection. Heart is normal in size. No pericardial effusion. Mediastinum/Nodes: Patulous distal esophagus. No esophageal wall thickening. Scattered mediastinal and hilar nodes, largest prevascular measuring 11 mm, likely reactive. No suspicious thyroid nodule. No pneumomediastinum. Lungs/Pleura: Patchy bilateral ground-glass opacities, left greater than right. No pleural fluid or pneumothorax. Trachea and central bronchi are patent. Upper Abdomen: No acute findings.  Suspected hepatic steatosis. Musculoskeletal: Left gynecomastia. There are no acute or suspicious osseous abnormalities. Review of the MIP images confirms the above findings. IMPRESSION: 1. No pulmonary embolus. 2. Patchy bilateral ground-glass opacities, left greater than right, consistent with COVID-19 pneumonia. Parenchymal involvement is moderate. Electronically Signed   By: Melanie  Sanford M.D.   On: 12/10/2019 23:12   DG Chest Portable 1 View  Result Date: 12/10/2019 CLINICAL DATA:  SOB/COVID+(09/02); patient unvaccinated. Smoker. EXAM: PORTABLE CHEST 1 VIEW COMPARISON:  12/06/2019 FINDINGS: Hazy bilateral airspace lung opacities have developed since the prior chest radiograph, predominantly in the lung bases and mid lungs. No evidence of pulmonary edema. No pleural effusion or pneumothorax. Cardiac silhouette  normal in size.  No mediastinal or hilar masses. Skeletal structures are grossly intact. IMPRESSION: 1. Hazy bilateral airspace lung opacities consistent with multifocal COVID-19 pneumonia, developing since previous chest radiograph. Electronically Signed   By: David  Ormond M.D.   On: 12/10/2019 18:10    Chart has been reviewed   Assessment/Plan   24 y.o. male with medical history significant of obesity, Eczema, migraines,     Admitted for Pneumonia secondary to Covid with acute respiratory failure hypoxia  Present on Admission:  . Pneumonia due to COVID-19 virus -   FROM HOME  WITH KNOWN HX OF COVID19 ER Novel Corona Virus testing:  Ordered 12/06/19 and is  positive  Immunization status:non imunized   Following concerning LAB/ imaging findings:  COVID-19 Labs  Recent Labs    12/10/19 1928  DDIMER 0.74*  FERRITIN 888*  LDH 530*  CRP 4.4*   Hepatic Function Latest Ref Rng & Units 12/10/2019 12/06/2019 02/04/2012  Total Protein 6.5 - 8.1 g/dL 7.0 7.2 7.6  Albumin 3.5 - 5.0 g/dL 3.4(L) 3.7 4.3  AST 15 - 41 U/L 58(H) 44(H) 19  ALT 0 - 44 U/L 41 61(H) 16  Alk Phosphatase 38 - 126 U/L 42 62 73  Total Bilirubin 0.3 - 1.2 mg/dL 0.9 0.7 0.5   Lab Results  Component Value Date   CREATININE 1.21 12/10/2019   CREATININE 0.92 12/06/2019   CBC    Component Value Date/Time   WBC 3.6 (L) 12/10/2019 1928   PLT 183 12/10/2019 1928   LYMPHSABS 0.8 12/10/2019 1928    Lab Results  Component Value Date   SARSCOV2NAA POSITIVE (A) 12/06/2019    CBC: leukopenia, lymphopenia      BMP: increased BUN/Cr   LFTs: increased AST/ALT/Tbili  CRP, LDH: increased   IL-6 and Ferritin increased  Procalcitonin: low <0.1 CXR: hazy bilateral peripheral opacities     CT chest: GGO,      -Following work-up initiated:      sputum cultures  Ordered 12/11/19, Blood cultures  Ordered 12/11/19,   CTA neg for PE   Following complications noted:  elevated LFT's likely in the setting of COVID  continue to follow   nausea/vomiting Diarrhea , decreased PO intake resulting in dehydration - will rehydrate  acute respiratory failure with hypoxia - continue oxygen treatment  Plan of treatment: Admit on Airborn Precautions  -given severity of illness initiate steroids Decadron   6mg q 24 hours And pharmacy consult for remdesivir - IF Hypoxia progresses rapidly  or  requiring high nasal flow   and no contraindications will attempt a trial of Baricitinib or Actemra (discussed with patient risks and benefits) - Will follow daily d.dimer - Assess for ability to prone  - Supportive management -Fluid sparing resuscitation  -Provide oxygen as needed currently on 2L  SpO2: 95 % - IF d.dimer elvated >5 will increase dose of lovenox   - Consult PCCM if becomes respiratory unstable   Poor Prognostic factors  24 y.o.  Personal hx of  obesity, NON-Vaccinated status Evidence of  organ damage  presen  tachypnea, tachycardia present on admission  ABS neutrophil to lymphocyte ratio >3.5 Risk factors for Cytokine storm  CXR GGO Ferritin >250 and Lymphopenia   elevated LFT,  LDH >400      Will order Airborne and Contact precautions  Family/ patient prognosis discussion: I have discussed case with the family/ patient  who are aware of their prognosis At this point they would like to be full code     The treatment plan and use of medications and known side effects were discussed with patient . It was clearly explained that there is no proven definitive treatment for COVID-19 infection yet. Any medications used here are based on case reports/anecdotal data which are not peer-reviewed and has not been studied using randomized control trials.  Complete risks and long-term side effects are unknown, however in the best clinical judgment they seem to be of some clinical benefit rather than medical risks.  Patient agree with the treatment plan and want to receive these treatments as indicated.     . Acute  respiratory disease due to COVID-19 virus order continuous pulse ox pronate if able, continue oxygen as needed   . Obesity (BMI 30.0-34.9) puts patient at risk for worsening prognosis from Covid  Hypokalemia -secondary to diarrhea nausea/vomiting below replace and check magnesium level  Other plan as per orders.  DVT prophylaxis:  Lovenox       Code Status:    Code Status: Prior FULL CODE   as per patient   I had personally discussed CODE STATUS with patient     Family Communication:   Family not at  Bedside    Disposition Plan:     To home once workup is complete and patient is stable   Following barriers for discharge:                            Hypoxia stable                               Afebrile,  able to transition to PO antibiotics                             Will need to be able to tolerate PO                            Will likely need home health, home O2, set up            Consults called: none  Admission status:  ED Disposition    ED Disposition Condition Comment   Admit  The patient appears reasonably stabilized for admission considering the current resources, flow, and capabilities   available in the ED at this time, and I doubt any other [facility] requiring further screening and/or treatment in the ED prior to admission is  present.         inpatient     I Expect 2 midnight stay secondary to severity of patient's current illness need for inpatient interventions justified by the following:  hemodynamic instability despite optimal treatment (tachycardia   Hypoxia, )   Severe lab/radiological/exam abnormalities including:    COVID PNA and extensive comorbidities including:   Obesity   That are currently affecting medical management.   I expect  patient to be hospitalized for 2 midnights requiring inpatient medical care.  Patient is at high risk for adverse outcome (such as loss of life or disability) if not treated.  Indication for inpatient stay as follows:     Hemodynamic instability despite maximal medical therapy,  New or worsening hypoxia  Need for IV antivirals IV fluids,    Level of care    tele    indefinitely please discontinue once patient no longer qualifies COVID-19 Labs    Lab Results  Component Value Date   [place] (A) 12/06/2019     Precautions: admitted as covid positive Airborne and Contact precautions    PPE: Used by the provider:   P100  eye Goggles,  Gloves  gown    Rhyan Wolters 12/11/2019, 1:15 AM    [facility]     after 2 AM please page floor coverage PA If 7AM-7PM, please contact the day team taking care of the patient using [email or web address]   Patient was evaluated in the context of the global COVID-19 pandemic, which necessitated consideration that the patient might be at risk for infection with the SARS-CoV-2 virus that causes COVID-19. Institutional protocols and algorithms that pertain to the evaluation of patients at risk for COVID-19 are in a state of rapid change based on information released by regulatory bodies including the CDC and federal and state organizations. These policies and algorithms were followed during the patient's care.

## 2019-12-11 NOTE — ED Notes (Signed)
Got patient up to the bedside toilet patient has call bell in reach 

## 2019-12-11 NOTE — Progress Notes (Signed)
PHARMACY - PHYSICIAN COMMUNICATION CRITICAL VALUE ALERT - BLOOD CULTURE IDENTIFICATION (BCID)  Mitchell Steele is an 24 y.o. male who presented to [facility] on 12/10/2019 with a chief complaint of SOB with positive COVID test currently being treated with remdesivir.   Assessment:  Bcx 2of4 bottles growing GPC in clusters identified as staph epi (most likely a contaminant)  Name of physician (or Provider) Contacted: Dr. Maryland Pink  Current antibiotics: none   Changes to prescribed antibiotics recommended:  Patient is on recommended antibiotics - No changes needed  Romilda Garret, PharmD PGY1 Acute Care Pharmacy Resident Phone: [phone] 12/11/2019 2:24 PM  Please check [email or web address] for unit specific pharmacy phone numbers.

## 2019-12-12 DIAGNOSIS — E871 Hypo-osmolality and hyponatremia: Secondary | ICD-10-CM

## 2019-12-12 DIAGNOSIS — E119 Type 2 diabetes mellitus without complications: Secondary | ICD-10-CM

## 2019-12-12 DIAGNOSIS — J9601 Acute respiratory failure with hypoxia: Secondary | ICD-10-CM | POA: Insufficient documentation

## 2019-12-12 LAB — CBC WITH DIFFERENTIAL/PLATELET
Abs Immature Granulocytes: 0.04 10*3/uL (ref 0.00–0.07)
Basophils Absolute: 0 10*3/uL (ref 0.0–0.1)
Basophils Relative: 0 %
Eosinophils Absolute: 0 10*3/uL (ref 0.0–0.5)
Eosinophils Relative: 0 %
HCT: 43.7 % (ref 39.0–52.0)
Hemoglobin: 14.1 g/dL (ref 13.0–17.0)
Immature Granulocytes: 1 %
Lymphocytes Relative: 15 %
Lymphs Abs: 0.7 10*3/uL (ref 0.7–4.0)
MCH: 25.5 pg — ABNORMAL LOW (ref 26.0–34.0)
MCHC: 32.3 g/dL (ref 30.0–36.0)
MCV: 79 fL — ABNORMAL LOW (ref 80.0–100.0)
Monocytes Absolute: 0.5 10*3/uL (ref 0.1–1.0)
Monocytes Relative: 11 %
Neutro Abs: 3.2 10*3/uL (ref 1.7–7.7)
Neutrophils Relative %: 73 %
Platelets: 253 10*3/uL (ref 150–400)
RBC: 5.53 MIL/uL (ref 4.22–5.81)
RDW: 14.6 % (ref 11.5–15.5)
WBC: 4.4 10*3/uL (ref 4.0–10.5)
nRBC: 0 % (ref 0.0–0.2)

## 2019-12-12 LAB — COMPREHENSIVE METABOLIC PANEL
ALT: 44 U/L (ref 0–44)
AST: 49 U/L — ABNORMAL HIGH (ref 15–41)
Albumin: 3.1 g/dL — ABNORMAL LOW (ref 3.5–5.0)
Alkaline Phosphatase: 36 U/L — ABNORMAL LOW (ref 38–126)
Anion gap: 7 (ref 5–15)
BUN: 15 mg/dL (ref 6–20)
CO2: 30 mmol/L (ref 22–32)
Calcium: 8.8 mg/dL — ABNORMAL LOW (ref 8.9–10.3)
Chloride: 102 mmol/L (ref 98–111)
Creatinine, Ser: 0.92 mg/dL (ref 0.61–1.24)
GFR calc Af Amer: >60 mL/min (ref >60)
GFR calc non Af Amer: >60 mL/min (ref >60)
Glucose, Bld: 175 mg/dL — ABNORMAL HIGH (ref 70–99)
Potassium: 4 mmol/L (ref 3.5–5.1)
Sodium: 139 mmol/L (ref 135–145)
Total Bilirubin: 0.3 mg/dL (ref 0.3–1.2)
Total Protein: 6.7 g/dL (ref 6.5–8.1)

## 2019-12-12 LAB — HEMOGLOBIN A1C
Hgb A1c MFr Bld: 7.1 % — ABNORMAL HIGH (ref 4.8–5.6)
Mean Plasma Glucose: 157.07 mg/dL

## 2019-12-12 LAB — D-DIMER, QUANTITATIVE: D-Dimer, Quant: 0.68 ug/mL-FEU — ABNORMAL HIGH (ref 0.00–0.50)

## 2019-12-12 LAB — GLUCOSE, CAPILLARY
Glucose-Capillary: 265 mg/dL — ABNORMAL HIGH (ref 70–99)
Glucose-Capillary: 210 mg/dL — ABNORMAL HIGH (ref 70–99)

## 2019-12-12 LAB — C-REACTIVE PROTEIN: CRP: 1 mg/dL — ABNORMAL HIGH (ref <1.0)

## 2019-12-12 LAB — MAGNESIUM: Magnesium: 2.3 mg/dL (ref 1.7–2.4)

## 2019-12-12 LAB — CBG MONITORING, ED
Glucose-Capillary: 154 mg/dL — ABNORMAL HIGH (ref 70–99)
Glucose-Capillary: 188 mg/dL — ABNORMAL HIGH (ref 70–99)

## 2019-12-12 LAB — ABO/RH: ABO/RH(D): O POS

## 2019-12-12 MED ORDER — INSULIN ASPART 100 UNIT/ML ~~LOC~~ SOLN
0.0000 [IU] | Freq: Every day | SUBCUTANEOUS | Status: DC
  Administered 2019-12-12 – 2019-12-13 (×2): 2 [IU] via SUBCUTANEOUS

## 2019-12-12 MED ORDER — INSULIN ASPART 100 UNIT/ML ~~LOC~~ SOLN
6.0000 [IU] | Freq: Three times a day (TID) | SUBCUTANEOUS | Status: DC
  Administered 2019-12-12 – 2019-12-13 (×2): 6 [IU] via SUBCUTANEOUS

## 2019-12-12 MED ORDER — INSULIN DETEMIR 100 UNIT/ML ~~LOC~~ SOLN
5.0000 [IU] | Freq: Two times a day (BID) | SUBCUTANEOUS | Status: DC
  Administered 2019-12-12 – 2019-12-13 (×3): 5 [IU] via SUBCUTANEOUS
  Filled 2019-12-12 (×4): qty 0.05

## 2019-12-12 MED ORDER — INSULIN ASPART 100 UNIT/ML ~~LOC~~ SOLN
0.0000 [IU] | Freq: Three times a day (TID) | SUBCUTANEOUS | Status: DC
  Administered 2019-12-12 (×2): 4 [IU] via SUBCUTANEOUS
  Administered 2019-12-12: 11 [IU] via SUBCUTANEOUS
  Administered 2019-12-13: 3 [IU] via SUBCUTANEOUS
  Administered 2019-12-13: 4 [IU] via SUBCUTANEOUS
  Administered 2019-12-13 – 2019-12-14 (×2): 7 [IU] via SUBCUTANEOUS
  Administered 2019-12-14 (×2): 4 [IU] via SUBCUTANEOUS
  Administered 2019-12-15: 7 [IU] via SUBCUTANEOUS
  Administered 2019-12-15: 4 [IU] via SUBCUTANEOUS

## 2019-12-12 NOTE — Progress Notes (Signed)
Mitchell Steele is a 24 y.o. male patient admitted from ED awake, alert - oriented  X 4 - no acute distress noted.  VSS - Blood pressure 131/72, pulse (!) 102, temperature 98.6 F (37 C), temperature source Oral, resp. rate (!) 24, height 5' 4.37" (1.635 m), weight 113.4 kg, SpO2 97 %.    IV in place, occlusive dsg intact without redness.  Call light within reach, patient able to voice, and demonstrate understanding.  Skin, clean-dry- intact without evidence of bruising, or skin tears.   No evidence of skin break down noted on exam.   Will cont to eval and treat per MD orders.  Jeanella Craze, RN 12/12/2019 4:26 PM

## 2019-12-12 NOTE — ED Notes (Signed)
Attempted report x1. 

## 2019-12-12 NOTE — Progress Notes (Signed)
   12/12/19 1619  Assess: MEWS Score  Temp 98.6 F (37 C)  BP 131/72  Pulse Rate (!) 102  ECG Heart Rate (!) 105  Resp (!) 24  SpO2 97 %  O2 Device Nasal Cannula  O2 Flow Rate (L/min) 2 L/min  Assess: MEWS Score  MEWS Temp 0  MEWS Systolic 0  MEWS Pulse 1  MEWS RR 1  MEWS LOC 0  MEWS Score 2  MEWS Score Color Yellow  Assess: if the MEWS score is Yellow or Red  Were vital signs taken at a resting state? Yes  Focused Assessment No change from prior assessment  Early Detection of Sepsis Score *See Row Information* Low  MEWS guidelines implemented *See Row Information* Yes  Take Vital Signs  Increase Vital Sign Frequency  Yellow: Q 2hr X 2 then Q 4hr X 2, if remains yellow, continue Q 4hrs  Escalate  MEWS: Escalate Yellow: discuss with charge nurse/RN and consider discussing with provider and RRT  Notify: Charge Nurse/RN  Name of Charge Nurse/RN Notified Judson Roch   Date Charge Nurse/RN Notified 12/12/19  Time Charge Nurse/RN Notified 1630

## 2019-12-12 NOTE — Progress Notes (Addendum)
[facility] PROGRESS NOTE    Progress Note  Mitchell Steele  QBH:[id number] DOB: 1995/11/24 DOA: 12/10/2019 PCP: Patient, No Pcp Per     Brief Narrative:   Mitchell Steele is an 24 y.o. male past medical history of obesity comes into the emergency room room for shortness of breath.  She relates that about 4 to 5 days prior to admission she started getting a sore throat generalized weakness and fatigue she tested positive for SARS-CoV-2, she was scheduled to receive antimonoclonal antibodies but was unable to reach her appointment comes in today with fever shortness of breath nausea and vomiting. Assessment/Plan:   Acute respiratory failure with hypoxia secondary to pneumonia due to COVID-19: She has defervesced this admission, he still requiring 3-5 L of oxygen try to keep saturations greater than 92%. She was started on clear on IV remdesivir and steroids. Blood cultures, blood ID reflex panel showed staph epidermidis this is probably a contaminant. His inflammatory markers are slowly trending down his saturations are remained stable. Try to keep the patient prone for a 16 hours a day, not prone out of bed to chair, encourage incentive spirometry. Inflammatory markers are improving we will continue to trend. Patient is respiking fever and oxygen requirements have increased to 5 L.  Check a procalcitonin level.  Diabetes mellitus type 2 without complications: Newly diagnosed, will start him on long-acting insulin plus sliding scale he is currently on steroids. We will start on long-acting insulin plus sliding scale resistant. As an outpatient tolerating metformin.  Acute kidney injury: Unknown baseline his creatinine has decreased from 1.2 on admission to 0.9. Likely prerenal azotemia in the setting of nausea vomiting and dehydration he was started on IV fluids now resolved has returned to baseline.  Hypovolemic hyponatremia: Resolved with IV fluid  hydration.  Hypokalemia: Replete orally now resolved.  Elevated LFTs: Likely due to COVID-19 we will continue to monitor intermittently either slowly trending down.  Obesity (BMI 30.0-34.9) Counseling.    DVT prophylaxis: lovenox Family Communication:none Status is: Inpatient  Remains inpatient appropriate because:Persistent severe electrolyte disturbances   Dispo:  Patient From: Home  Planned Disposition: Home  Expected discharge date: 12/14/19  Medically stable for discharge: No    Code Status:     Code Status Orders  (From admission, onward)         Start     Ordered   12/11/19 0046  Full code  Continuous        12/11/19 0045        Code Status History    Date Active Date Inactive Code Status Order ID Comments User Context   02/07/2012 0753 02/07/2012 1536 Full Code [id number]  Army Fossa, RN ED   Advance Care Planning Activity        IV Access:    Peripheral IV   Procedures and diagnostic studies:   CT Angio Chest PE W and/or Wo Contrast  Result Date: 12/10/2019 CLINICAL DATA:  PE suspected, low/intermediate prob, positive D-dimer COVID positive with increasing shortness of breath. EXAM: CT ANGIOGRAPHY CHEST WITH CONTRAST TECHNIQUE: Multidetector CT imaging of the chest was performed using the standard protocol during bolus administration of intravenous contrast. Multiplanar CT image reconstructions and MIPs were obtained to evaluate the vascular anatomy. CONTRAST:  3mL OMNIPAQUE IOHEXOL 350 MG/ML SOLN COMPARISON:  Radiograph earlier this day. FINDINGS: Cardiovascular: There are no filling defects within the pulmonary arteries to suggest pulmonary embolus. The thoracic aorta is normal in caliber. No aortic dissection. Heart is normal in  size. No pericardial effusion. Mediastinum/Nodes: Patulous distal esophagus. No esophageal wall thickening. Scattered mediastinal and hilar nodes, largest prevascular measuring 11 mm, likely reactive. No suspicious  thyroid nodule. No pneumomediastinum. Lungs/Pleura: Patchy bilateral ground-glass opacities, left greater than right. No pleural fluid or pneumothorax. Trachea and central bronchi are patent. Upper Abdomen: No acute findings.  Suspected hepatic steatosis. Musculoskeletal: Left gynecomastia. There are no acute or suspicious osseous abnormalities. Review of the MIP images confirms the above findings. IMPRESSION: 1. No pulmonary embolus. 2. Patchy bilateral ground-glass opacities, left greater than right, consistent with COVID-19 pneumonia. Parenchymal involvement is moderate. Electronically Signed   By: Keith Rake M.D.   On: 12/10/2019 23:12   DG Chest Portable 1 View  Result Date: 12/10/2019 CLINICAL DATA:  SOB/COVID+(09/02); patient unvaccinated. Smoker. EXAM: PORTABLE CHEST 1 VIEW COMPARISON:  12/06/2019 FINDINGS: Hazy bilateral airspace lung opacities have developed since the prior chest radiograph, predominantly in the lung bases and mid lungs. No evidence of pulmonary edema. No pleural effusion or pneumothorax. Cardiac silhouette normal in size.  No mediastinal or hilar masses. Skeletal structures are grossly intact. IMPRESSION: 1. Hazy bilateral airspace lung opacities consistent with multifocal COVID-19 pneumonia, developing since previous chest radiograph. Electronically Signed   By: Lajean Manes M.D.   On: 12/10/2019 18:10     Medical Consultants:    None.  Anti-Infectives:   IV remdesivir  Subjective:    Mitchell Steele relates that his breathing is unchanged compared to yesterday.  Objective:    Vitals:   12/11/19 2230 12/11/19 2300 12/12/19 0230 12/12/19 0545  BP: (!) 108/53 135/70 111/62   Pulse: 88 91 81 91  Resp: (!) 33 (!) 35    Temp:      TempSrc:      SpO2: 91% 92% 98% 96%  Weight:      Height:       SpO2: 96 % O2 Flow Rate (L/min): 3 L/min   Intake/Output Summary (Last 24 hours) at 12/12/2019 0723 Last data filed at 12/11/2019 2219 Gross per 24 hour   Intake 650 ml  Output 875 ml  Net -225 ml   Filed Weights   12/11/19 0050  Weight: 113.4 kg    Exam: General exam: In no acute distress. Respiratory system: Has good air movement with crackles bilaterally.  Breathing sounds are distant likely due to morbid obesity. Cardiovascular system: S1 & S2 heard, RRR.  Gastrointestinal system: Abdomen is nondistended, soft and nontender.  Extremities: No pedal edema. Skin: No rashes, lesions or ulcers Psychiatry: Judgement and insight appear normal. Mood & affect appropriate.    Data Reviewed:    Labs: Basic Metabolic Panel: Recent Labs  Lab 12/06/19 1612 12/06/19 1612 12/10/19 1928 12/11/19 0441 12/12/19 0439  NA 137  --  133*  --  139  K 4.2   < > 3.2*  --  4.0  CL 104  --  96*  --  102  CO2 24  --  26  --  30  GLUCOSE 119*  --  123*  --  175*  BUN 9  --  8  --  15  CREATININE 0.92  --  1.21  --  0.92  CALCIUM 8.5*  --  8.2*  --  8.8*  MG  --   --   --  2.1 2.3   < > = values in this interval not displayed.   GFR Estimated Creatinine Clearance: 142.5 mL/min (by C-G formula based on SCr of 0.92 mg/dL). Liver Function Tests:  Recent Labs  Lab 12/06/19 1612 12/10/19 1928 12/12/19 0439  AST 44* 58* 49*  ALT 61* 41 44  ALKPHOS 62 42 36*  BILITOT 0.7 0.9 0.3  PROT 7.2 7.0 6.7  ALBUMIN 3.7 3.4* 3.1*   No results for input(s): LIPASE, AMYLASE in the last 168 hours. No results for input(s): AMMONIA in the last 168 hours. Coagulation profile No results for input(s): INR, PROTIME in the last 168 hours. COVID-19 Labs  Recent Labs    12/10/19 1928 12/12/19 0439  DDIMER 0.74* 0.68*  FERRITIN 888*  --   LDH 530*  --   CRP 4.4* 1.0*    Lab Results  Component Value Date   SARSCOV2NAA POSITIVE (A) 12/06/2019    CBC: Recent Labs  Lab 12/06/19 1612 12/10/19 1928 12/12/19 0439  WBC 7.3 3.6* 4.4  NEUTROABS 6.5 2.6 3.2  HGB 14.2 15.1 14.1  HCT 43.0 46.6 43.7  MCV 80.4 77.8* 79.0*  PLT 241 183 253   Cardiac  Enzymes: No results for input(s): CKTOTAL, CKMB, CKMBINDEX, TROPONINI in the last 168 hours. BNP (last 3 results) No results for input(s): PROBNP in the last 8760 hours. CBG: Recent Labs  Lab 12/11/19 1301 12/11/19 2216  GLUCAP 171* 186*   D-Dimer: Recent Labs    12/10/19 1928 12/12/19 0439  DDIMER 0.74* 0.68*   Hgb A1c: Recent Labs    12/12/19 0439  HGBA1C 7.1*   Lipid Profile: Recent Labs    12/10/19 2149 12/11/19 0441  CHOL  --  117  HDL  --  22*  LDLCALC  --  67  TRIG 172* 138  CHOLHDL  --  5.3   Thyroid function studies: No results for input(s): TSH, T4TOTAL, T3FREE, THYROIDAB in the last 72 hours.  Invalid input(s): FREET3 Anemia work up: Recent Labs    12/10/19 1928  FERRITIN 888*   Sepsis Labs: Recent Labs  Lab 12/06/19 1612 12/10/19 1928 12/10/19 1950 12/12/19 0439  PROCALCITON  --  <0.10  --   --   WBC 7.3 3.6*  --  4.4  LATICACIDVEN  --   --  1.1  --    Microbiology Recent Results (from the past 240 hour(s))  SARS Coronavirus 2 by RT PCR (hospital order, performed in [facility] hospital lab) Nasopharyngeal Nasopharyngeal Swab     Status: Abnormal   Collection Time: 12/06/19  9:56 AM   Specimen: Nasopharyngeal Swab  Result Value Ref Range Status   SARS Coronavirus 2 POSITIVE (A) NEGATIVE Final    Comment: emailed L. Berdik RN 11:30 12/06/19 ([email or web address]) (NOTE) SARS-CoV-2 target nucleic acids are DETECTED  SARS-CoV-2 RNA is generally detectable in upper respiratory specimens  during the acute phase of infection.  Positive results are indicative  of the presence of the identified virus, but do not rule out bacterial infection or co-infection with other pathogens not detected by the test.  Clinical correlation with patient history and  other diagnostic information is necessary to determine patient infection status.  The expected result is negative.  Fact Sheet for Patients:   StrictlyIdeas.no   Fact Sheet for  Healthcare Providers:   BankingDealers.co.za    This test is not yet approved or cleared by the [place] FDA and  has been authorized for detection and/or diagnosis of SARS-CoV-2 by FDA under an Emergency Use Authorization (EUA).  This EUA will remain in effect (meaning this test can be used) for the duration of  the  COVID-19 declaration under Section 564(b)(1) of the Act, 21  U.S.C. section 360-bbb-3(b)(1), unless the authorization is terminated or revoked sooner.  Performed at [facility], [place] [address] [place], [place] [address]   Blood Culture (routine x 2)     Status: None (Preliminary result)   Collection Time: 12/10/19  8:02 PM   Specimen: BLOOD  Result Value Ref Range Status   Specimen Description BLOOD SITE NOT SPECIFIED  Final   Special Requests   Final    BOTTLES DRAWN AEROBIC AND ANAEROBIC Blood Culture results may not be optimal due to an inadequate volume of blood received in culture bottles   Culture  Setup Time   Final    GRAM POSITIVE COCCI IN CLUSTERS IN BOTH AEROBIC AND ANAEROBIC BOTTLES CRITICAL RESULT CALLED TO, READ BACK BY AND VERIFIED WITH: PHARMD E Prescott [phone] AT 1420 BY CM Performed at [facility], [place] [address], [place], [place] [address]    Culture GRAM POSITIVE COCCI  Final   Report Status PENDING  Incomplete  Blood Culture ID Panel (Reflexed)     Status: Abnormal   Collection Time: 12/10/19  8:02 PM  Result Value Ref Range Status   Enterococcus faecalis NOT DETECTED NOT DETECTED Final   Enterococcus Faecium NOT DETECTED NOT DETECTED Final   Listeria monocytogenes NOT DETECTED NOT DETECTED Final   Staphylococcus species DETECTED (A) NOT DETECTED Final    Comment: CRITICAL RESULT CALLED TO, READ BACK BY AND VERIFIED WITH: pharmd e sullivan [id number] at 1420 by cm    Staphylococcus aureus (BCID) NOT DETECTED NOT DETECTED Final   Staphylococcus epidermidis DETECTED (A) NOT DETECTED Final    Comment:  Methicillin (oxacillin) resistant coagulase negative staphylococcus. Possible blood culture contaminant (unless isolated from more than one blood culture draw or clinical case suggests pathogenicity). No antibiotic treatment is indicated for blood  culture contaminants. CRITICAL RESULT CALLED TO, READ BACK BY AND VERIFIED WITH: pharmd e sullivan [phone] at 1420 by cm    Staphylococcus lugdunensis NOT DETECTED NOT DETECTED Final   Streptococcus species NOT DETECTED NOT DETECTED Final   Streptococcus agalactiae NOT DETECTED NOT DETECTED Final   Streptococcus pneumoniae NOT DETECTED NOT DETECTED Final   Streptococcus pyogenes NOT DETECTED NOT DETECTED Final   A.calcoaceticus-baumannii NOT DETECTED NOT DETECTED Final   Bacteroides fragilis NOT DETECTED NOT DETECTED Final   Enterobacterales NOT DETECTED NOT DETECTED Final   Enterobacter cloacae complex NOT DETECTED NOT DETECTED Final   Escherichia coli NOT DETECTED NOT DETECTED Final   Klebsiella aerogenes NOT DETECTED NOT DETECTED Final   Klebsiella oxytoca NOT DETECTED NOT DETECTED Final   Klebsiella pneumoniae NOT DETECTED NOT DETECTED Final   Proteus species NOT DETECTED NOT DETECTED Final   Salmonella species NOT DETECTED NOT DETECTED Final   Serratia marcescens NOT DETECTED NOT DETECTED Final   Haemophilus influenzae NOT DETECTED NOT DETECTED Final   Neisseria meningitidis NOT DETECTED NOT DETECTED Final   Pseudomonas aeruginosa NOT DETECTED NOT DETECTED Final   Stenotrophomonas maltophilia NOT DETECTED NOT DETECTED Final   Candida albicans NOT DETECTED NOT DETECTED Final   Candida auris NOT DETECTED NOT DETECTED Final   Candida glabrata NOT DETECTED NOT DETECTED Final   Candida krusei NOT DETECTED NOT DETECTED Final   Candida parapsilosis NOT DETECTED NOT DETECTED Final   Candida tropicalis NOT DETECTED NOT DETECTED Final   Cryptococcus neoformans/gattii NOT DETECTED NOT DETECTED Final   Methicillin resistance mecA/C DETECTED (A) NOT  DETECTED Final    Comment: CRITICAL RESULT CALLED TO, READ BACK BY AND VERIFIED WITH:  pharmd Joneen Caraway [phone] at 1420 by cm Performed at [facility], [place] [address] [place], [place] [address]      Medications:   . vitamin C  500 mg Oral Daily  . enoxaparin (LOVENOX) injection  58 mg Subcutaneous Q0600  . famotidine  20 mg Oral Daily  . insulin aspart  0-15 Units Subcutaneous TID WC  . methylPREDNISolone (SOLU-MEDROL) injection  60 mg Intravenous Q12H  . sodium chloride flush  3 mL Intravenous Q12H  . zinc sulfate  220 mg Oral Daily   Continuous Infusions: . remdesivir 100 mg in NS 100 mL        LOS: 1 day   Charlynne Cousins  [facility]  12/12/2019, 7:23 AM

## 2019-12-13 LAB — CBC WITH DIFFERENTIAL/PLATELET
Abs Immature Granulocytes: 0.22 10*3/uL — ABNORMAL HIGH (ref 0.00–0.07)
Basophils Absolute: 0 10*3/uL (ref 0.0–0.1)
Basophils Relative: 0 %
Eosinophils Absolute: 0 10*3/uL (ref 0.0–0.5)
Eosinophils Relative: 0 %
HCT: 44 % (ref 39.0–52.0)
Hemoglobin: 14.3 g/dL (ref 13.0–17.0)
Immature Granulocytes: 5 %
Lymphocytes Relative: 15 %
Lymphs Abs: 0.7 10*3/uL (ref 0.7–4.0)
MCH: 25.8 pg — ABNORMAL LOW (ref 26.0–34.0)
MCHC: 32.5 g/dL (ref 30.0–36.0)
MCV: 79.4 fL — ABNORMAL LOW (ref 80.0–100.0)
Monocytes Absolute: 0.4 10*3/uL (ref 0.1–1.0)
Monocytes Relative: 8 %
Neutro Abs: 3.4 10*3/uL (ref 1.7–7.7)
Neutrophils Relative %: 72 %
Platelets: 303 10*3/uL (ref 150–400)
RBC: 5.54 MIL/uL (ref 4.22–5.81)
RDW: 14.5 % (ref 11.5–15.5)
WBC: 4.6 10*3/uL (ref 4.0–10.5)
nRBC: 0 % (ref 0.0–0.2)

## 2019-12-13 LAB — CULTURE, BLOOD (ROUTINE X 2)

## 2019-12-13 LAB — MAGNESIUM: Magnesium: 2.5 mg/dL — ABNORMAL HIGH (ref 1.7–2.4)

## 2019-12-13 LAB — COMPREHENSIVE METABOLIC PANEL
ALT: 83 U/L — ABNORMAL HIGH (ref 0–44)
AST: 86 U/L — ABNORMAL HIGH (ref 15–41)
Albumin: 3.2 g/dL — ABNORMAL LOW (ref 3.5–5.0)
Alkaline Phosphatase: 34 U/L — ABNORMAL LOW (ref 38–126)
Anion gap: 10 (ref 5–15)
BUN: 15 mg/dL (ref 6–20)
CO2: 27 mmol/L (ref 22–32)
Calcium: 8.9 mg/dL (ref 8.9–10.3)
Chloride: 105 mmol/L (ref 98–111)
Creatinine, Ser: 0.88 mg/dL (ref 0.61–1.24)
GFR calc Af Amer: >60 mL/min (ref >60)
GFR calc non Af Amer: >60 mL/min (ref >60)
Glucose, Bld: 171 mg/dL — ABNORMAL HIGH (ref 70–99)
Potassium: 4 mmol/L (ref 3.5–5.1)
Sodium: 142 mmol/L (ref 135–145)
Total Bilirubin: 1.1 mg/dL (ref 0.3–1.2)
Total Protein: 7.1 g/dL (ref 6.5–8.1)

## 2019-12-13 LAB — C-REACTIVE PROTEIN: CRP: 0.7 mg/dL (ref <1.0)

## 2019-12-13 LAB — PROCALCITONIN: Procalcitonin: <0.1 ng/mL

## 2019-12-13 LAB — GLUCOSE, CAPILLARY
Glucose-Capillary: 179 mg/dL — ABNORMAL HIGH (ref 70–99)
Glucose-Capillary: 216 mg/dL — ABNORMAL HIGH (ref 70–99)
Glucose-Capillary: 143 mg/dL — ABNORMAL HIGH (ref 70–99)
Glucose-Capillary: 211 mg/dL — ABNORMAL HIGH (ref 70–99)

## 2019-12-13 LAB — D-DIMER, QUANTITATIVE: D-Dimer, Quant: 1.02 ug/mL-FEU — ABNORMAL HIGH (ref 0.00–0.50)

## 2019-12-13 MED ORDER — INSULIN DETEMIR 100 UNIT/ML ~~LOC~~ SOLN
10.0000 [IU] | Freq: Two times a day (BID) | SUBCUTANEOUS | Status: DC
  Administered 2019-12-13 – 2019-12-14 (×2): 10 [IU] via SUBCUTANEOUS
  Filled 2019-12-13 (×3): qty 0.1

## 2019-12-13 MED ORDER — ENOXAPARIN SODIUM 60 MG/0.6ML ~~LOC~~ SOLN
55.0000 mg | Freq: Every day | SUBCUTANEOUS | Status: DC
  Administered 2019-12-14 – 2019-12-16 (×3): 55 mg via SUBCUTANEOUS
  Filled 2019-12-13 (×3): qty 0.6

## 2019-12-13 MED ORDER — INSULIN ASPART 100 UNIT/ML ~~LOC~~ SOLN
10.0000 [IU] | Freq: Three times a day (TID) | SUBCUTANEOUS | Status: DC
  Administered 2019-12-13 – 2019-12-16 (×9): 10 [IU] via SUBCUTANEOUS

## 2019-12-13 NOTE — Progress Notes (Signed)
SATURATION QUALIFICATIONS: (This note is used to comply with regulatory documentation for home oxygen)  Patient Saturations on Room Air at Rest = 90%  Patient Saturations on Room Air while Ambulating = 80%  Patient Saturations on 6 Liters of oxygen while Ambulating = 91%  Please briefly explain why patient needs home oxygen: Pt still requiring oxygen at this time

## 2019-12-13 NOTE — Progress Notes (Addendum)
[facility] PROGRESS NOTE    Progress Note  Mitchell Steele  QBH:[id number] DOB: [birth year] DOA: 12/10/2019 PCP: Patient, No Pcp Per     Brief Narrative:   Mitchell Steele is an 24 y.o. male past medical history of obesity comes into the emergency room room for shortness of breath.  She relates that about 4 to 5 days prior to admission she started getting a sore throat generalized weakness and fatigue she tested positive for SARS-CoV-2, she was scheduled to receive antimonoclonal antibodies but was unable to reach her appointment comes in today with fever shortness of breath nausea and vomiting. Assessment/Plan:   Acute respiratory failure with hypoxia secondary to pneumonia due to COVID-19: Patient is now requiring 2 to 4 L of oxygen to keep saturation greater than 92%. Continue IV remdesivir and steroids. His leukocytosis is resolved, his inflammatory markers continue to improve. She was started on clear on IV remdesivir and steroids. Blood cultures, blood ID reflex panel showed staph epidermidis this is probably a contaminant. Try to keep the patient prone for at least 16 hours a day if not prone out of bed to chair, continue to use incentive spirometry.  Diabetes mellitus type 2 without complications: Newly diagnosed, blood glucose continues to be significantly high will increase long-acting insulin and meal coverage, continue sliding scale assistance.   Acute kidney injury: Likely prerenal azotemia in the setting of COVID-19 nausea vomiting resolved with IV fluid hydration his creatinine has returned to baseline KVO IV fluids.  Hypovolemic hyponatremia: Resolved with IV fluid hydration.  Hypokalemia: Replete orally now resolved.  Elevated LFTs: Mildly worsened today, continue to monitor likely due to COVID-19  Morbid obesity: Counseling.    DVT prophylaxis: lovenox Family Communication:none Status is: Inpatient  Remains inpatient appropriate because:Persistent  severe electrolyte disturbances   Dispo:  Patient From: Home  Planned Disposition: Home  Expected discharge date: 12/13/19  Medically stable for discharge: No    Code Status:     Code Status Orders  (From admission, onward)           Start     Ordered   12/11/19 0046  Full code  Continuous        12/11/19 0045           Code Status History     Date Active Date Inactive Code Status Order ID Comments User Context   02/07/2012 0753 02/07/2012 1536 Full Code [id number]  Army Fossa, RN ED   Advance Care Planning Activity         IV Access:   Peripheral IV   Procedures and diagnostic studies:   No results found.   Medical Consultants:   None.  Anti-Infectives:   IV remdesivir  Subjective:    Mitchell Steele he relates his breathing is unchanged compared to yesterday.  Objective:    Vitals:   12/13/19 0423 12/13/19 0541 12/13/19 0600 12/13/19 0732  BP: (!) 141/78  136/73   Pulse: 73  84 87  Resp: (!) 22 (!) 38 (!) 21 20  Temp: 98.5 F (36.9 C)  98.7 F (37.1 C) 98.8 F (37.1 C)  TempSrc: Oral   Oral  SpO2: 94% (!) 82% 93%   Weight:      Height:       SpO2: 93 % O2 Flow Rate (L/min): 4 L/min   Intake/Output Summary (Last 24 hours) at 12/13/2019 1004 Last data filed at 12/13/2019 0902 Gross per 24 hour  Intake 700 ml  Output 1605 ml  Net -  905 ml   Filed Weights   12/11/19 0050  Weight: 113.4 kg    Exam: General exam: In no acute distress. Respiratory system: Good air movement with diffuse crackles bilaterally. Cardiovascular system: S1 & S2 heard, RRR. No JVD. Gastrointestinal system: Abdomen is nondistended, soft and nontender.  Extremities: No pedal edema. Skin: No rashes, lesions or ulcers Psychiatry: Judgement and insight appear normal. Mood & affect appropriate.   Data Reviewed:    Labs: Basic Metabolic Panel: Recent Labs  Lab 12/06/19 1612 12/06/19 1612 12/10/19 1928 12/10/19 1928 12/11/19 0441  12/12/19 0439 12/13/19 0729  NA 137  --  133*  --   --  139 142  K 4.2   < > 3.2*   < >  --  4.0 4.0  CL 104  --  96*  --   --  102 105  CO2 24  --  26  --   --  30 27  GLUCOSE 119*  --  123*  --   --  175* 171*  BUN 9  --  8  --   --  15 15  CREATININE 0.92  --  1.21  --   --  0.92 0.88  CALCIUM 8.5*  --  8.2*  --   --  8.8* 8.9  MG  --   --   --   --  2.1 2.3 2.5*   < > = values in this interval not displayed.   GFR Estimated Creatinine Clearance: 149 mL/min (by C-G formula based on SCr of 0.88 mg/dL). Liver Function Tests: Recent Labs  Lab 12/06/19 1612 12/10/19 1928 12/12/19 0439 12/13/19 0729  AST 44* 58* 49* 86*  ALT 61* 41 44 83*  ALKPHOS 62 42 36* 34*  BILITOT 0.7 0.9 0.3 1.1  PROT 7.2 7.0 6.7 7.1  ALBUMIN 3.7 3.4* 3.1* 3.2*   No results for input(s): LIPASE, AMYLASE in the last 168 hours. No results for input(s): AMMONIA in the last 168 hours. Coagulation profile No results for input(s): INR, PROTIME in the last 168 hours. COVID-19 Labs  Recent Labs    12/10/19 1928 12/12/19 0439 12/13/19 0729  DDIMER 0.74* 0.68* 1.02*  FERRITIN 888*  --   --   LDH 530*  --   --   CRP 4.4* 1.0* 0.7    Lab Results  Component Value Date   SARSCOV2NAA POSITIVE (A) 12/06/2019    CBC: Recent Labs  Lab 12/06/19 1612 12/10/19 1928 12/12/19 0439 12/13/19 0729  WBC 7.3 3.6* 4.4 4.6  NEUTROABS 6.5 2.6 3.2 3.4  HGB 14.2 15.1 14.1 14.3  HCT 43.0 46.6 43.7 44.0  MCV 80.4 77.8* 79.0* 79.4*  PLT 241 183 253 303   Cardiac Enzymes: No results for input(s): CKTOTAL, CKMB, CKMBINDEX, TROPONINI in the last 168 hours. BNP (last 3 results) No results for input(s): PROBNP in the last 8760 hours. CBG: Recent Labs  Lab 12/12/19 0827 12/12/19 1205 12/12/19 1634 12/12/19 2100 12/13/19 0731  GLUCAP 154* 188* 265* 210* 179*   D-Dimer: Recent Labs    12/12/19 0439 12/13/19 0729  DDIMER 0.68* 1.02*   Hgb A1c: Recent Labs    12/12/19 0439  HGBA1C 7.1*   Lipid  Profile: Recent Labs    12/10/19 2149 12/11/19 0441  CHOL  --  117  HDL  --  22*  LDLCALC  --  67  TRIG 172* 138  CHOLHDL  --  5.3   Thyroid function studies: No results for input(s): TSH, T4TOTAL,  T3FREE, THYROIDAB in the last 72 hours.  Invalid input(s): FREET3 Anemia work up: Recent Labs    12/10/19 1928  FERRITIN 888*   Sepsis Labs: Recent Labs  Lab 12/06/19 1612 12/10/19 1928 12/10/19 1950 12/12/19 0439 12/13/19 0729  PROCALCITON  --  <0.10  --   --   --   WBC 7.3 3.6*  --  4.4 4.6  LATICACIDVEN  --   --  1.1  --   --    Microbiology Recent Results (from the past 240 hour(s))  SARS Coronavirus 2 by RT PCR (hospital order, performed in [facility] hospital lab) Nasopharyngeal Nasopharyngeal Swab     Status: Abnormal   Collection Time: 12/06/19  9:56 AM   Specimen: Nasopharyngeal Swab  Result Value Ref Range Status   SARS Coronavirus 2 POSITIVE (A) NEGATIVE Final    Comment: emailed L. Berdik RN 11:30 12/06/19 ([email or web address]) (NOTE) SARS-CoV-2 target nucleic acids are DETECTED  SARS-CoV-2 RNA is generally detectable in upper respiratory specimens  during the acute phase of infection.  Positive results are indicative  of the presence of the identified virus, but do not rule out bacterial infection or co-infection with other pathogens not detected by the test.  Clinical correlation with patient history and  other diagnostic information is necessary to determine patient infection status.  The expected result is negative.  Fact Sheet for Patients:   StrictlyIdeas.no   Fact Sheet for Healthcare Providers:   BankingDealers.co.za    This test is not yet approved or cleared by the [place] FDA and  has been authorized for detection and/or diagnosis of SARS-CoV-2 by FDA under an Emergency Use Authorization (EUA).  This EUA will remain in effect (meaning this test can be used) for the duration of  the  COVID-19  declaration under Section 564(b)(1) of the Act, 21 U.S.C. section 360-bbb-3(b)(1), unless the authorization is terminated or revoked sooner.  Performed at [facility], [place] [address], [place], [place] [address]   Blood Culture (routine x 2)     Status: Abnormal   Collection Time: 12/10/19  8:02 PM   Specimen: BLOOD  Result Value Ref Range Status   Specimen Description BLOOD SITE NOT SPECIFIED  Final   Special Requests   Final    BOTTLES DRAWN AEROBIC AND ANAEROBIC Blood Culture results may not be optimal due to an inadequate volume of blood received in culture bottles   Culture  Setup Time   Final    GRAM POSITIVE COCCI IN CLUSTERS IN BOTH AEROBIC AND ANAEROBIC BOTTLES CRITICAL RESULT CALLED TO, READ BACK BY AND VERIFIED WITH: PHARMD E [place] [phone] AT 1420 BY CM    Culture (A)  Final    STAPHYLOCOCCUS HAEMOLYTICUS STAPHYLOCOCCUS EPIDERMIDIS THE SIGNIFICANCE OF ISOLATING THIS ORGANISM FROM A SINGLE VENIPUNCTURE CANNOT BE PREDICTED WITHOUT FURTHER CLINICAL AND CULTURE CORRELATION. SUSCEPTIBILITIES AVAILABLE ONLY ON REQUEST. Performed at [facility], [place] [address], [place], [place] [address]    Report Status 12/13/2019 FINAL  Final  Blood Culture ID Panel (Reflexed)     Status: Abnormal   Collection Time: 12/10/19  8:02 PM  Result Value Ref Range Status   Enterococcus faecalis NOT DETECTED NOT DETECTED Final   Enterococcus Faecium NOT DETECTED NOT DETECTED Final   Listeria monocytogenes NOT DETECTED NOT DETECTED Final   Staphylococcus species DETECTED (A) NOT DETECTED Final    Comment: CRITICAL RESULT CALLED TO, READ BACK BY AND VERIFIED WITH: pharmd e sullivan [phone] at 1420 by cm  Staphylococcus aureus (BCID) NOT DETECTED NOT DETECTED Final   Staphylococcus epidermidis DETECTED (A) NOT DETECTED Final    Comment: Methicillin (oxacillin) resistant coagulase negative staphylococcus. Possible blood culture contaminant (unless isolated from more than one blood  culture draw or clinical case suggests pathogenicity). No antibiotic treatment is indicated for blood  culture contaminants. CRITICAL RESULT CALLED TO, READ BACK BY AND VERIFIED WITH: pharmd e sullivan [phone] at 1420 by cm    Staphylococcus lugdunensis NOT DETECTED NOT DETECTED Final   Streptococcus species NOT DETECTED NOT DETECTED Final   Streptococcus agalactiae NOT DETECTED NOT DETECTED Final   Streptococcus pneumoniae NOT DETECTED NOT DETECTED Final   Streptococcus pyogenes NOT DETECTED NOT DETECTED Final   A.calcoaceticus-baumannii NOT DETECTED NOT DETECTED Final   Bacteroides fragilis NOT DETECTED NOT DETECTED Final   Enterobacterales NOT DETECTED NOT DETECTED Final   Enterobacter cloacae complex NOT DETECTED NOT DETECTED Final   Escherichia coli NOT DETECTED NOT DETECTED Final   Klebsiella aerogenes NOT DETECTED NOT DETECTED Final   Klebsiella oxytoca NOT DETECTED NOT DETECTED Final   Klebsiella pneumoniae NOT DETECTED NOT DETECTED Final   Proteus species NOT DETECTED NOT DETECTED Final   Salmonella species NOT DETECTED NOT DETECTED Final   Serratia marcescens NOT DETECTED NOT DETECTED Final   Haemophilus influenzae NOT DETECTED NOT DETECTED Final   Neisseria meningitidis NOT DETECTED NOT DETECTED Final   Pseudomonas aeruginosa NOT DETECTED NOT DETECTED Final   Stenotrophomonas maltophilia NOT DETECTED NOT DETECTED Final   Candida albicans NOT DETECTED NOT DETECTED Final   Candida auris NOT DETECTED NOT DETECTED Final   Candida glabrata NOT DETECTED NOT DETECTED Final   Candida krusei NOT DETECTED NOT DETECTED Final   Candida parapsilosis NOT DETECTED NOT DETECTED Final   Candida tropicalis NOT DETECTED NOT DETECTED Final   Cryptococcus neoformans/gattii NOT DETECTED NOT DETECTED Final   Methicillin resistance mecA/C DETECTED (A) NOT DETECTED Final    Comment: CRITICAL RESULT CALLED TO, READ BACK BY AND VERIFIED WITH: pharmd Joneen Caraway [phone] at 1420 by cm Performed at  [facility], [address], [place], [place] [address]      Medications:    vitamin C  500 mg Oral Daily   enoxaparin (LOVENOX) injection  58 mg Subcutaneous Q0600   famotidine  20 mg Oral Daily   insulin aspart  0-20 Units Subcutaneous TID WC   insulin aspart  0-5 Units Subcutaneous QHS   insulin aspart  6 Units Subcutaneous TID WC   insulin detemir  5 Units Subcutaneous BID   methylPREDNISolone (SOLU-MEDROL) injection  60 mg Intravenous Q12H   sodium chloride flush  3 mL Intravenous Q12H   zinc sulfate  220 mg Oral Daily   Continuous Infusions:  remdesivir 100 mg in NS 100 mL 100 mg (12/13/19 0843)      LOS: 2 days   Charlynne Cousins  [facility]  12/13/2019, 10:04 AM

## 2019-12-13 NOTE — TOC Initial Note (Addendum)
Transition of Care [facility]) - Initial/Assessment Note    Patient Details  Name: Mitchell Steele MRN: [id number] Date of Birth: 02-01-1996  [facility]) CM/SW Contact:    Verdell Carmine, RN Phone Number: 12/13/2019, 5:24 PM  Clinical Narrative:                 Admitted for COVID Pneumonia.  Attempted to call room to discuss oxygen requirements.  and ask about  any home care needs.  No answer at this time. Oxygen qualifications show patient on 6L to reach 90% Will re-revaulate IN AM . CM will follow for any needs. Patient has no PCP listed, will make appointment at post [place] clinic in Am and have patient follow with [facility].  Expected Discharge Plan: Home/Self Care Barriers to Discharge: Continued Medical Work up   Patient Goals and CMS Choice        Expected Discharge Plan and Services Expected Discharge Plan: Home/Self Care   Discharge Planning Services: CM Consult (Patient has job   no insurance)   Living arrangements for the past 2 months: Single Family Home                                      Prior Living Arrangements/Services Living arrangements for the past 2 months: Single Family Home   Patient language and need for interpreter reviewed:: Yes        Need for Family Participation in Patient Care: Yes (Comment) Care giver support system in place?: Yes (comment)   Criminal Activity/Legal Involvement Pertinent to Current Situation/Hospitalization: No - Comment as needed  Activities of Daily Living Home Assistive Devices/Equipment: None ADL Screening (condition at time of admission) Patient's cognitive ability adequate to safely complete daily activities?: No Is the patient deaf or have difficulty hearing?: No Does the patient have difficulty seeing, even when wearing glasses/contacts?: No Does the patient have difficulty concentrating, remembering, or making decisions?: No Patient able to express need for assistance with ADLs?: No Does the patient  have difficulty dressing or bathing?: No Independently performs ADLs?: Yes (appropriate for developmental age) Does the patient have difficulty walking or climbing stairs?: No Weakness of Legs: None Weakness of Arms/Hands: None  Permission Sought/Granted                  Emotional Assessment       Orientation: : Oriented to Situation, Oriented to  Time, Oriented to Place, Oriented to Self Alcohol / Substance Use: Not Applicable Psych Involvement: No (comment)  Admission diagnosis:  Acute respiratory failure with hypoxia ([place]) [J96.01] Pneumonia due to COVID-19 virus [U07.1, J12.82] Patient Active Problem List   Diagnosis Date Noted  . Diabetes mellitus type 2, controlled, without complications ([place]) 21/19/4174  . Hyponatremia 12/12/2019  . Acute respiratory failure with hypoxia ([place]) 12/12/2019  . Obesity (BMI 30.0-34.9) 12/11/2019  . Pneumonia due to COVID-19 virus 12/11/2019  . Chronic tension headaches 02/27/2014  . Cerumen impaction 02/27/2014  . Gastroenteritis 05/28/2013  . Exposure to STD 07/04/2012  . Constipation 04/03/2012  . Screening for STD (sexually transmitted disease) 04/03/2012  . Suicide attempt ([place]) 02/08/2012  . Depression 02/08/2012  . Anxiety 02/08/2012  . 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 Allergic rhinitis, seasonal 02/19/2011  . MIGRAINE HEADACHE 04/10/2010  . ECZEMA 03/21/2009  . 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 [place] [address] Phone: [phone] Fax: [phone]  [place], [place] - [place] [address] Phone: [phone] Fax: [phone]     Social Determinants of Health (SDOH) Interventions    Readmission Risk Interventions No flowsheet data found.

## 2019-12-13 NOTE — Plan of Care (Signed)

## 2019-12-14 LAB — D-DIMER, QUANTITATIVE: D-Dimer, Quant: 1.32 ug/mL-FEU — ABNORMAL HIGH (ref 0.00–0.50)

## 2019-12-14 LAB — CBC WITH DIFFERENTIAL/PLATELET
Abs Immature Granulocytes: 0.23 10*3/uL — ABNORMAL HIGH (ref 0.00–0.07)
Basophils Absolute: 0 10*3/uL (ref 0.0–0.1)
Basophils Relative: 0 %
Eosinophils Absolute: 0 10*3/uL (ref 0.0–0.5)
Eosinophils Relative: 0 %
HCT: 44.4 % (ref 39.0–52.0)
Hemoglobin: 14.3 g/dL (ref 13.0–17.0)
Immature Granulocytes: 3 %
Lymphocytes Relative: 12 %
Lymphs Abs: 0.9 10*3/uL (ref 0.7–4.0)
MCH: 25.4 pg — ABNORMAL LOW (ref 26.0–34.0)
MCHC: 32.2 g/dL (ref 30.0–36.0)
MCV: 78.9 fL — ABNORMAL LOW (ref 80.0–100.0)
Monocytes Absolute: 0.5 10*3/uL (ref 0.1–1.0)
Monocytes Relative: 7 %
Neutro Abs: 5.4 10*3/uL (ref 1.7–7.7)
Neutrophils Relative %: 78 %
Platelets: 340 10*3/uL (ref 150–400)
RBC: 5.63 MIL/uL (ref 4.22–5.81)
RDW: 14.5 % (ref 11.5–15.5)
WBC: 7 10*3/uL (ref 4.0–10.5)
nRBC: 0 % (ref 0.0–0.2)

## 2019-12-14 LAB — COMPREHENSIVE METABOLIC PANEL
ALT: 109 U/L — ABNORMAL HIGH (ref 0–44)
AST: 63 U/L — ABNORMAL HIGH (ref 15–41)
Albumin: 3.4 g/dL — ABNORMAL LOW (ref 3.5–5.0)
Alkaline Phosphatase: 38 U/L (ref 38–126)
Anion gap: 9 (ref 5–15)
BUN: 17 mg/dL (ref 6–20)
CO2: 28 mmol/L (ref 22–32)
Calcium: 9 mg/dL (ref 8.9–10.3)
Chloride: 105 mmol/L (ref 98–111)
Creatinine, Ser: 0.96 mg/dL (ref 0.61–1.24)
GFR calc Af Amer: >60 mL/min (ref >60)
GFR calc non Af Amer: >60 mL/min (ref >60)
Glucose, Bld: 176 mg/dL — ABNORMAL HIGH (ref 70–99)
Potassium: 4.1 mmol/L (ref 3.5–5.1)
Sodium: 142 mmol/L (ref 135–145)
Total Bilirubin: 1 mg/dL (ref 0.3–1.2)
Total Protein: 7.3 g/dL (ref 6.5–8.1)

## 2019-12-14 LAB — PROCALCITONIN: Procalcitonin: <0.1 ng/mL

## 2019-12-14 LAB — C-REACTIVE PROTEIN: CRP: 0.6 mg/dL (ref <1.0)

## 2019-12-14 LAB — GLUCOSE, CAPILLARY
Glucose-Capillary: 165 mg/dL — ABNORMAL HIGH (ref 70–99)
Glucose-Capillary: 185 mg/dL — ABNORMAL HIGH (ref 70–99)
Glucose-Capillary: 242 mg/dL — ABNORMAL HIGH (ref 70–99)
Glucose-Capillary: 170 mg/dL — ABNORMAL HIGH (ref 70–99)

## 2019-12-14 LAB — MAGNESIUM: Magnesium: 2.5 mg/dL — ABNORMAL HIGH (ref 1.7–2.4)

## 2019-12-14 MED ORDER — INSULIN DETEMIR 100 UNIT/ML ~~LOC~~ SOLN
20.0000 [IU] | Freq: Two times a day (BID) | SUBCUTANEOUS | Status: DC
  Administered 2019-12-14 – 2019-12-16 (×4): 20 [IU] via SUBCUTANEOUS
  Filled 2019-12-14 (×5): qty 0.2

## 2019-12-14 NOTE — Progress Notes (Signed)
[facility] PROGRESS NOTE    Progress Note  Mitchell Steele  ZOX:[id number] DOB: Feb 05, 1996 DOA: 12/10/2019 PCP: Patient, No Pcp Per     Brief Narrative:   Mitchell Steele is an 24 y.o. male past medical history of obesity comes into the emergency room room for shortness of breath.  She relates that about 4 to 5 days prior to admission she started getting a sore throat generalized weakness and fatigue she tested positive for SARS-CoV-2, she was scheduled to receive antimonoclonal antibodies but was unable to reach her appointment comes in today with fever shortness of breath nausea and vomiting. Assessment/Plan:   Acute respiratory failure with hypoxia secondary to pneumonia due to COVID-19: His oxygen requirement have increased overnight now he is 4 L to keep saturations greater than 92%. Continue IV remdesivir and steroids, his inflammatory markers are significantly improved. 1 out of 2 blood cultures grew staph hemolyticus and staph epididymitis probably a contaminant.  Has remained afebrile. Try to keep the patient prone for 16 hours a day if not prone out of bed to chair, continue to use incentive spirometry and flutter valve  Diabetes mellitus type 2 without complications: Newly diagnosed, blood glucose continues to be significantly high will increase long-acting insulin and meal coverage, continue sliding scale assistance.   Acute kidney injury: Likely prerenal azotemia in the setting of COVID-19 nausea vomiting resolved with IV fluid hydration his creatinine has returned to baseline KVO IV fluids.  Hypovolemic hyponatremia: Resolved with IV fluid hydration.  Hypokalemia: Replete orally now resolved.  Elevated LFTs: Mildly worsened today, continue to monitor likely due to COVID-19  Morbid obesity: Counseling.    DVT prophylaxis: lovenox Family Communication:none Status is: Inpatient  Remains inpatient appropriate because:Persistent severe electrolyte  disturbances   Dispo:  Patient From: Home  Planned Disposition: Home  Expected discharge date: 12/15/19  Medically stable for discharge: No    Code Status:     Code Status Orders  (From admission, onward)           Start     Ordered   12/11/19 0046  Full code  Continuous        12/11/19 0045           Code Status History     Date Active Date Inactive Code Status Order ID Comments User Context   02/07/2012 0753 02/07/2012 1536 Full Code [id number]  Army Fossa, RN ED   Advance Care Planning Activity         IV Access:    Peripheral IV   Procedures and diagnostic studies:   No results found.   Medical Consultants:    None.  Anti-Infectives:   IV remdesivir  Subjective:    Mitchell Steele relates his breathing is unchanged compared to yesterday.  Objective:    Vitals:   12/13/19 0732 12/13/19 1204 12/13/19 2012 12/14/19 0434  BP:  140/73 105/88 111/63  Pulse: 87  99 81  Resp: 20  (!) 24 16  Temp: 98.8 F (37.1 C) 99.1 F (37.3 C) 98.6 F (37 C) 98.8 F (37.1 C)  TempSrc: Oral Oral Oral Oral  SpO2:  94% 91% 95%  Weight:      Height:       SpO2: 95 % O2 Flow Rate (L/min): 4 L/min   Intake/Output Summary (Last 24 hours) at 12/14/2019 1134 Last data filed at 12/14/2019 1027 Gross per 24 hour  Intake 480 ml  Output --  Net 480 ml   [employer]  12/11/19 0050  Weight: 113.4 kg    Exam: General exam: In no acute distress. Respiratory system: Good air movement and clear to auscultation. Cardiovascular system: S1 & S2 heard, RRR. No JVD. Gastrointestinal system: Abdomen is nondistended, soft and nontender.  Extremities: No pedal edema. Skin: No rashes, lesions or ulcers  Data Reviewed:    Labs: Basic Metabolic Panel: Recent Labs  Lab 12/10/19 1928 12/10/19 1928 12/11/19 0441 12/12/19 0439 12/12/19 0439 12/13/19 0729 12/14/19 0736  NA 133*  --   --  139  --  142 142  K 3.2*   < >  --  4.0   < > 4.0 4.1  CL  96*  --   --  102  --  105 105  CO2 26  --   --  30  --  27 28  GLUCOSE 123*  --   --  175*  --  171* 176*  BUN 8  --   --  15  --  15 17  CREATININE 1.21  --   --  0.92  --  0.88 0.96  CALCIUM 8.2*  --   --  8.8*  --  8.9 9.0  MG  --   --  2.1 2.3  --  2.5* 2.5*   < > = values in this interval not displayed.   GFR Estimated Creatinine Clearance: 136.6 mL/min (by C-G formula based on SCr of 0.96 mg/dL). Liver Function Tests: Recent Labs  Lab 12/10/19 1928 12/12/19 0439 12/13/19 0729 12/14/19 0736  AST 58* 49* 86* 63*  ALT 41 44 83* 109*  ALKPHOS 42 36* 34* 38  BILITOT 0.9 0.3 1.1 1.0  PROT 7.0 6.7 7.1 7.3  ALBUMIN 3.4* 3.1* 3.2* 3.4*   No results for input(s): LIPASE, AMYLASE in the last 168 hours. No results for input(s): AMMONIA in the last 168 hours. Coagulation profile No results for input(s): INR, PROTIME in the last 168 hours. COVID-19 Labs  Recent Labs    12/12/19 0439 12/13/19 0729 12/14/19 0736  DDIMER 0.68* 1.02* 1.32*  CRP 1.0* 0.7 0.6    Lab Results  Component Value Date   SARSCOV2NAA POSITIVE (A) 12/06/2019    CBC: Recent Labs  Lab 12/10/19 1928 12/12/19 0439 12/13/19 0729 12/14/19 0736  WBC 3.6* 4.4 4.6 7.0  NEUTROABS 2.6 3.2 3.4 5.4  HGB 15.1 14.1 14.3 14.3  HCT 46.6 43.7 44.0 44.4  MCV 77.8* 79.0* 79.4* 78.9*  PLT 183 253 303 340   Cardiac Enzymes: No results for input(s): CKTOTAL, CKMB, CKMBINDEX, TROPONINI in the last 168 hours. BNP (last 3 results) No results for input(s): PROBNP in the last 8760 hours. CBG: Recent Labs  Lab 12/13/19 1205 12/13/19 1701 12/13/19 2014 12/14/19 0714 12/14/19 1132  GLUCAP 216* 143* 211* 165* 185*   D-Dimer: Recent Labs    12/13/19 0729 12/14/19 0736  DDIMER 1.02* 1.32*   Hgb A1c: Recent Labs    12/12/19 0439  HGBA1C 7.1*   Lipid Profile: No results for input(s): CHOL, HDL, LDLCALC, TRIG, CHOLHDL, LDLDIRECT in the last 72 hours. Thyroid function studies: No results for input(s):  TSH, T4TOTAL, T3FREE, THYROIDAB in the last 72 hours.  Invalid input(s): FREET3 Anemia work up: No results for input(s): VITAMINB12, FOLATE, FERRITIN, TIBC, IRON, RETICCTPCT in the last 72 hours. Sepsis Labs: Recent Labs  Lab 12/10/19 1928 12/10/19 1950 12/12/19 0439 12/13/19 0729 12/14/19 0736  PROCALCITON <0.10  --   --  <0.10 <0.10  WBC 3.6*  --  4.4  4.6 7.0  LATICACIDVEN  --  1.1  --   --   --    Microbiology Recent Results (from the past 240 hour(s))  SARS Coronavirus 2 by RT PCR (hospital order, performed in [facility] hospital lab) Nasopharyngeal Nasopharyngeal Swab     Status: Abnormal   Collection Time: 12/06/19  9:56 AM   Specimen: Nasopharyngeal Swab  Result Value Ref Range Status   SARS Coronavirus 2 POSITIVE (A) NEGATIVE Final    Comment: emailed L. Berdik RN 11:30 12/06/19 ([email or web address]) (NOTE) SARS-CoV-2 target nucleic acids are DETECTED  SARS-CoV-2 RNA is generally detectable in upper respiratory specimens  during the acute phase of infection.  Positive results are indicative  of the presence of the identified virus, but do not rule out bacterial infection or co-infection with other pathogens not detected by the test.  Clinical correlation with patient history and  other diagnostic information is necessary to determine patient infection status.  The expected result is negative.  Fact Sheet for Patients:   StrictlyIdeas.no   Fact Sheet for Healthcare Providers:   BankingDealers.co.za    This test is not yet approved or cleared by the [place] FDA and  has been authorized for detection and/or diagnosis of SARS-CoV-2 by FDA under an Emergency Use Authorization (EUA).  This EUA will remain in effect (meaning this test can be used) for the duration of  the  COVID-19 declaration under Section 564(b)(1) of the Act, 21 U.S.C. section 360-bbb-3(b)(1), unless the authorization is terminated or revoked  sooner.  Performed at [facility], [place] [address], [place], [place] [address]   Blood Culture (routine x 2)     Status: Abnormal   Collection Time: 12/10/19  8:02 PM   Specimen: BLOOD  Result Value Ref Range Status   Specimen Description BLOOD SITE NOT SPECIFIED  Final   Special Requests   Final    BOTTLES DRAWN AEROBIC AND ANAEROBIC Blood Culture results may not be optimal due to an inadequate volume of blood received in culture bottles   Culture  Setup Time   Final    GRAM POSITIVE COCCI IN CLUSTERS IN BOTH AEROBIC AND ANAEROBIC BOTTLES CRITICAL RESULT CALLED TO, READ BACK BY AND VERIFIED WITH: PHARMD E Fostoria [id number] AT 1420 BY CM    Culture (A)  Final    STAPHYLOCOCCUS HAEMOLYTICUS STAPHYLOCOCCUS EPIDERMIDIS THE SIGNIFICANCE OF ISOLATING THIS ORGANISM FROM A SINGLE VENIPUNCTURE CANNOT BE PREDICTED WITHOUT FURTHER CLINICAL AND CULTURE CORRELATION. SUSCEPTIBILITIES AVAILABLE ONLY ON REQUEST. Performed at [facility], [place] [address] [place], [place] [address]    Report Status 12/13/2019 FINAL  Final  Blood Culture ID Panel (Reflexed)     Status: Abnormal   Collection Time: 12/10/19  8:02 PM  Result Value Ref Range Status   Enterococcus faecalis NOT DETECTED NOT DETECTED Final   Enterococcus Faecium NOT DETECTED NOT DETECTED Final   Listeria monocytogenes NOT DETECTED NOT DETECTED Final   Staphylococcus species DETECTED (A) NOT DETECTED Final    Comment: CRITICAL RESULT CALLED TO, READ BACK BY AND VERIFIED WITH: pharmd e sullivan [id number] at 1420 by cm    Staphylococcus aureus (BCID) NOT DETECTED NOT DETECTED Final   Staphylococcus epidermidis DETECTED (A) NOT DETECTED Final    Comment: Methicillin (oxacillin) resistant coagulase negative staphylococcus. Possible blood culture contaminant (unless isolated from more than one blood culture draw or clinical case suggests pathogenicity). No antibiotic treatment is indicated for blood  culture contaminants. CRITICAL  RESULT CALLED TO, READ BACK  BY AND VERIFIED WITH: pharmd e [place] [phone] at 1420 by cm    Staphylococcus lugdunensis NOT DETECTED NOT DETECTED Final   Streptococcus species NOT DETECTED NOT DETECTED Final   Streptococcus agalactiae NOT DETECTED NOT DETECTED Final   Streptococcus pneumoniae NOT DETECTED NOT DETECTED Final   Streptococcus pyogenes NOT DETECTED NOT DETECTED Final   A.calcoaceticus-baumannii NOT DETECTED NOT DETECTED Final   Bacteroides fragilis NOT DETECTED NOT DETECTED Final   Enterobacterales NOT DETECTED NOT DETECTED Final   Enterobacter cloacae complex NOT DETECTED NOT DETECTED Final   Escherichia coli NOT DETECTED NOT DETECTED Final   Klebsiella aerogenes NOT DETECTED NOT DETECTED Final   Klebsiella oxytoca NOT DETECTED NOT DETECTED Final   Klebsiella pneumoniae NOT DETECTED NOT DETECTED Final   Proteus species NOT DETECTED NOT DETECTED Final   Salmonella species NOT DETECTED NOT DETECTED Final   Serratia marcescens NOT DETECTED NOT DETECTED Final   Haemophilus influenzae NOT DETECTED NOT DETECTED Final   Neisseria meningitidis NOT DETECTED NOT DETECTED Final   Pseudomonas aeruginosa NOT DETECTED NOT DETECTED Final   Stenotrophomonas maltophilia NOT DETECTED NOT DETECTED Final   Candida albicans NOT DETECTED NOT DETECTED Final   Candida auris NOT DETECTED NOT DETECTED Final   Candida glabrata NOT DETECTED NOT DETECTED Final   Candida krusei NOT DETECTED NOT DETECTED Final   Candida parapsilosis NOT DETECTED NOT DETECTED Final   Candida tropicalis NOT DETECTED NOT DETECTED Final   Cryptococcus neoformans/gattii NOT DETECTED NOT DETECTED Final   Methicillin resistance mecA/C DETECTED (A) NOT DETECTED Final    Comment: CRITICAL RESULT CALLED TO, READ BACK BY AND VERIFIED WITH: pharmd Joneen Caraway [phone] at 1420 by cm Performed at [facility], [address], [place], [place] [address]      Medications:   . vitamin C  500 mg Oral Daily  . enoxaparin  (LOVENOX) injection  55 mg Subcutaneous Q0600  . famotidine  20 mg Oral Daily  . insulin aspart  0-20 Units Subcutaneous TID WC  . insulin aspart  0-5 Units Subcutaneous QHS  . insulin aspart  10 Units Subcutaneous TID WC  . insulin detemir  10 Units Subcutaneous BID  . methylPREDNISolone (SOLU-MEDROL) injection  60 mg Intravenous Q12H  . sodium chloride flush  3 mL Intravenous Q12H  . zinc sulfate  220 mg Oral Daily   Continuous Infusions: . remdesivir 100 mg in NS 100 mL 100 mg (12/14/19 0912)      LOS: 3 days   Charlynne Cousins  [facility]  12/14/2019, 11:34 AM

## 2019-12-14 NOTE — TOC Progression Note (Signed)
Transition of Care [facility]) - Progression Note    Patient Details  Name: Mitchell Steele MRN: [id number] Date of Birth: Oct 05, 1995  Transition of Care [facility]) CM/SW [place], RN Phone Number: 12/14/2019, 9:58 AM  Clinical Narrative:    Oxygen qualification yesterday revealed patient needs 6L on ambulation.  [place] clinic appointment made and placed in patient instructions. CM will continue to monitor for needs, will need oxygen for home use.    Expected Discharge Plan: Home/Self Care Barriers to Discharge: Continued Medical Work up  Expected Discharge Plan and Services Expected Discharge Plan: Home/Self Care   Discharge Planning Services: CM Consult (Patient has job   no insurance)   Living arrangements for the past 2 months: Single Family Home                                       Social Determinants of Health (SDOH) Interventions    Readmission Risk Interventions No flowsheet data found.

## 2019-12-15 LAB — COMPREHENSIVE METABOLIC PANEL
ALT: 93 U/L — ABNORMAL HIGH (ref 0–44)
AST: 35 U/L (ref 15–41)
Albumin: 3.3 g/dL — ABNORMAL LOW (ref 3.5–5.0)
Alkaline Phosphatase: 39 U/L (ref 38–126)
Anion gap: 8 (ref 5–15)
BUN: 19 mg/dL (ref 6–20)
CO2: 30 mmol/L (ref 22–32)
Calcium: 8.9 mg/dL (ref 8.9–10.3)
Chloride: 103 mmol/L (ref 98–111)
Creatinine, Ser: 0.94 mg/dL (ref 0.61–1.24)
GFR calc Af Amer: >60 mL/min (ref >60)
GFR calc non Af Amer: >60 mL/min (ref >60)
Glucose, Bld: 159 mg/dL — ABNORMAL HIGH (ref 70–99)
Potassium: 4.4 mmol/L (ref 3.5–5.1)
Sodium: 141 mmol/L (ref 135–145)
Total Bilirubin: 0.9 mg/dL (ref 0.3–1.2)
Total Protein: 6.6 g/dL (ref 6.5–8.1)

## 2019-12-15 LAB — CBC WITH DIFFERENTIAL/PLATELET
Abs Immature Granulocytes: 0.28 10*3/uL — ABNORMAL HIGH (ref 0.00–0.07)
Basophils Absolute: 0 10*3/uL (ref 0.0–0.1)
Basophils Relative: 1 %
Eosinophils Absolute: 0 10*3/uL (ref 0.0–0.5)
Eosinophils Relative: 0 %
HCT: 43.9 % (ref 39.0–52.0)
Hemoglobin: 14.4 g/dL (ref 13.0–17.0)
Immature Granulocytes: 3 %
Lymphocytes Relative: 15 %
Lymphs Abs: 1.3 10*3/uL (ref 0.7–4.0)
MCH: 25.9 pg — ABNORMAL LOW (ref 26.0–34.0)
MCHC: 32.8 g/dL (ref 30.0–36.0)
MCV: 79.1 fL — ABNORMAL LOW (ref 80.0–100.0)
Monocytes Absolute: 0.6 10*3/uL (ref 0.1–1.0)
Monocytes Relative: 8 %
Neutro Abs: 6.2 10*3/uL (ref 1.7–7.7)
Neutrophils Relative %: 73 %
Platelets: 396 10*3/uL (ref 150–400)
RBC: 5.55 MIL/uL (ref 4.22–5.81)
RDW: 14.4 % (ref 11.5–15.5)
WBC: 8.4 10*3/uL (ref 4.0–10.5)
nRBC: 0 % (ref 0.0–0.2)

## 2019-12-15 LAB — D-DIMER, QUANTITATIVE: D-Dimer, Quant: 1.04 ug/mL-FEU — ABNORMAL HIGH (ref 0.00–0.50)

## 2019-12-15 LAB — C-REACTIVE PROTEIN: CRP: <0.5 mg/dL (ref <1.0)

## 2019-12-15 LAB — GLUCOSE, CAPILLARY
Glucose-Capillary: 195 mg/dL — ABNORMAL HIGH (ref 70–99)
Glucose-Capillary: 229 mg/dL — ABNORMAL HIGH (ref 70–99)
Glucose-Capillary: 119 mg/dL — ABNORMAL HIGH (ref 70–99)
Glucose-Capillary: 99 mg/dL (ref 70–99)

## 2019-12-15 LAB — MAGNESIUM: Magnesium: 2.4 mg/dL (ref 1.7–2.4)

## 2019-12-15 MED ORDER — METFORMIN HCL 500 MG PO TABS
500.0000 mg | ORAL_TABLET | Freq: Two times a day (BID) | ORAL | Status: DC
  Administered 2019-12-15 – 2019-12-16 (×3): 500 mg via ORAL
  Filled 2019-12-15 (×3): qty 1

## 2019-12-15 MED ORDER — PREDNISONE 20 MG PO TABS
50.0000 mg | ORAL_TABLET | Freq: Every day | ORAL | Status: DC
  Administered 2019-12-16: 50 mg via ORAL
  Filled 2019-12-15: qty 2

## 2019-12-15 NOTE — Progress Notes (Signed)
SATURATION QUALIFICATIONS: (This note is used to comply with regulatory documentation for home oxygen)  Patient Saturations on Room Air at Rest= 100%  Patient Saturations on Room Air while Ambulating = 97%   Please briefly explain why patient needs home oxygen: pt did not need oxygen with ambulating

## 2019-12-15 NOTE — Progress Notes (Signed)
[facility] PROGRESS NOTE    Progress Note  Mitchell Steele  OXB:[id number] DOB: 04/10/1995 DOA: 12/10/2019 PCP: Patient, No Pcp Per     Brief Narrative:   Mitchell Steele is an 24 y.o. male past medical history of obesity comes into the emergency room room for shortness of breath.  She relates that about 4 to 5 days prior to admission she started getting a sore throat generalized weakness and fatigue she tested positive for SARS-CoV-2, she was scheduled to receive antimonoclonal antibodies but was unable to reach her appointment comes in today with fever shortness of breath nausea and vomiting. Assessment/Plan:   Acute respiratory failure with hypoxia secondary to pneumonia due to COVID-19: His oxygen requirements have improved this morning. He will complete his course of IV remdesivir today, will continue IV steroids, his inflammatory markers continue to improve. Mitchell Steele is still requiring 2 L of oxygen to keep his oxygen greater than 90% he is done a great job proning there is 3-day in a row that I find him prone, I encouraged him to do this.  I am encouraged by his motivation I have encouraged him to continue to use incentive spirometry. I will ambulate him and check his saturations with ambulation.  Diabetes mellitus type 2 without complications: Currently on long-acting insulin plus sliding scale his blood glucose remained high, will start him on oral Metformin.  Acute kidney injury: Likely prerenal azotemia in the setting of COVID-19 nausea vomiting resolved with IV fluid hydration his creatinine has returned to baseline KVO IV fluids.  Hypovolemic hyponatremia: Resolved with IV fluid hydration.  Hypokalemia: Replete orally now resolved.  Elevated LFTs: Mildly worsened today, continue to monitor likely due to COVID-19  Morbid obesity: Counseling.    DVT prophylaxis: lovenox Family Communication:none Status is: Inpatient  Remains inpatient appropriate  because:Persistent severe electrolyte disturbances   Dispo:  Patient From: Home  Planned Disposition: Home  Expected discharge date: 12/15/19  Medically stable for discharge: No    Code Status:     Code Status Orders  (From admission, onward)           Start     Ordered   12/11/19 0046  Full code  Continuous        12/11/19 0045           Code Status History     Date Active Date Inactive Code Status Order ID Comments User Context   02/07/2012 0753 02/07/2012 1536 Full Code [id number]  Army Fossa, RN ED   Advance Care Planning Activity         IV Access:    Peripheral IV   Procedures and diagnostic studies:   No results found.   Medical Consultants:    None.  Anti-Infectives:   IV remdesivir  Subjective:    Mitchell Steele relates he is breathing is much better.  Objective:    Vitals:   12/14/19 0434 12/14/19 1356 12/14/19 1955 12/15/19 0431  BP: 111/63 (!) 96/50 (!) 115/96 113/85  Pulse: 81 62 88 85  Resp: 16 20 (!) 22 (!) 21  Temp: 98.8 F (37.1 C) 99 F (37.2 C) 98.7 F (37.1 C) 99 F (37.2 C)  TempSrc: Oral Oral Oral Oral  SpO2: 95% 95% 98% 98%  Weight:      Height:       SpO2: 98 % O2 Flow Rate (L/min): 2 L/min   Intake/Output Summary (Last 24 hours) at 12/15/2019 0833 Last data filed at 12/15/2019 2297 Gross per 24 hour  Intake 483 ml  Output 800 ml  Net -317 ml   Filed Weights   12/11/19 0050  Weight: 113.4 kg    Exam: General exam: In no acute distress. Respiratory system: Good air movement and clear to auscultation. Cardiovascular system: S1 & S2 heard, RRR. No JVD. Gastrointestinal system: Abdomen is nondistended, soft and nontender.  NoExtremities: No pedal edema. Skin: No rashes, lesions or ulcers I Data Reviewed:    Labs: Basic Metabolic Panel: Recent Labs  Lab 12/10/19 1928 12/10/19 1928 12/11/19 0441 12/12/19 0439 12/12/19 0439 12/13/19 0729 12/13/19 0729 12/14/19 0736 12/15/19 0320    NA 133*  --   --  139  --  142  --  142 141  K 3.2*   < >  --  4.0   < > 4.0   < > 4.1 4.4  CL 96*  --   --  102  --  105  --  105 103  CO2 26  --   --  30  --  27  --  28 30  GLUCOSE 123*  --   --  175*  --  171*  --  176* 159*  BUN 8  --   --  15  --  15  --  17 19  CREATININE 1.21  --   --  0.92  --  0.88  --  0.96 0.94  CALCIUM 8.2*  --   --  8.8*  --  8.9  --  9.0 8.9  MG  --   --  2.1 2.3  --  2.5*  --  2.5* 2.4   < > = values in this interval not displayed.   GFR Estimated Creatinine Clearance: 139.5 mL/min (by C-G formula based on SCr of 0.94 mg/dL). Liver Function Tests: Recent Labs  Lab 12/10/19 1928 12/12/19 0439 12/13/19 0729 12/14/19 0736 12/15/19 0320  AST 58* 49* 86* 63* 35  ALT 41 44 83* 109* 93*  ALKPHOS 42 36* 34* 38 39  BILITOT 0.9 0.3 1.1 1.0 0.9  PROT 7.0 6.7 7.1 7.3 6.6  ALBUMIN 3.4* 3.1* 3.2* 3.4* 3.3*   No results for input(s): LIPASE, AMYLASE in the last 168 hours. No results for input(s): AMMONIA in the last 168 hours. Coagulation profile No results for input(s): INR, PROTIME in the last 168 hours. COVID-19 Labs  Recent Labs    12/13/19 0729 12/14/19 0736 12/15/19 0320  DDIMER 1.02* 1.32* 1.04*  CRP 0.7 0.6 <0.5    Lab Results  Component Value Date   SARSCOV2NAA POSITIVE (A) 12/06/2019    CBC: Recent Labs  Lab 12/10/19 1928 12/12/19 0439 12/13/19 0729 12/14/19 0736 12/15/19 0320  WBC 3.6* 4.4 4.6 7.0 8.4  NEUTROABS 2.6 3.2 3.4 5.4 6.2  HGB 15.1 14.1 14.3 14.3 14.4  HCT 46.6 43.7 44.0 44.4 43.9  MCV 77.8* 79.0* 79.4* 78.9* 79.1*  PLT 183 253 303 340 396   Cardiac Enzymes: No results for input(s): CKTOTAL, CKMB, CKMBINDEX, TROPONINI in the last 168 hours. BNP (last 3 results) No results for input(s): PROBNP in the last 8760 hours. CBG: Recent Labs  Lab 12/14/19 0714 12/14/19 1132 12/14/19 1600 12/14/19 2100 12/15/19 0745  GLUCAP 165* 185* 242* 170* 195*   D-Dimer: Recent Labs    12/14/19 0736 12/15/19 0320   DDIMER 1.32* 1.04*   Hgb A1c: No results for input(s): HGBA1C in the last 72 hours. Lipid Profile: No results for input(s): CHOL, HDL, LDLCALC, TRIG, CHOLHDL, LDLDIRECT in the last 72  hours. Thyroid function studies: No results for input(s): TSH, T4TOTAL, T3FREE, THYROIDAB in the last 72 hours.  Invalid input(s): FREET3 Anemia work up: No results for input(s): VITAMINB12, FOLATE, FERRITIN, TIBC, IRON, RETICCTPCT in the last 72 hours. Sepsis Labs: Recent Labs  Lab 12/10/19 1928 12/10/19 1928 12/10/19 1950 12/12/19 0439 12/13/19 0729 12/14/19 0736 12/15/19 0320  PROCALCITON <0.10  --   --   --  <0.10 <0.10  --   WBC 3.6*   < >  --  4.4 4.6 7.0 8.4  LATICACIDVEN  --   --  1.1  --   --   --   --    < > = values in this interval not displayed.   Microbiology Recent Results (from the past 240 hour(s))  SARS Coronavirus 2 by RT PCR (hospital order, performed in [facility] hospital lab) Nasopharyngeal Nasopharyngeal Swab     Status: Abnormal   Collection Time: 12/06/19  9:56 AM   Specimen: Nasopharyngeal Swab  Result Value Ref Range Status   SARS Coronavirus 2 POSITIVE (A) NEGATIVE Final    Comment: emailed L. Berdik RN 11:30 12/06/19 ([email or web address]) (NOTE) SARS-CoV-2 target nucleic acids are DETECTED  SARS-CoV-2 RNA is generally detectable in upper respiratory specimens  during the acute phase of infection.  Positive results are indicative  of the presence of the identified virus, but do not rule out bacterial infection or co-infection with other pathogens not detected by the test.  Clinical correlation with patient history and  other diagnostic information is necessary to determine patient infection status.  The expected result is negative.  Fact Sheet for Patients:   StrictlyIdeas.no   Fact Sheet for Healthcare Providers:   BankingDealers.co.za    This test is not yet approved or cleared by the [place] FDA and  has been  authorized for detection and/or diagnosis of SARS-CoV-2 by FDA under an Emergency Use Authorization (EUA).  This EUA will remain in effect (meaning this test can be used) for the duration of  the  COVID-19 declaration under Section 564(b)(1) of the Act, 21 U.S.C. section 360-bbb-3(b)(1), unless the authorization is terminated or revoked sooner.  Performed at [facility], [place] [address], [place], [place] [address]   Blood Culture (routine x 2)     Status: Abnormal   Collection Time: 12/10/19  8:02 PM   Specimen: BLOOD  Result Value Ref Range Status   Specimen Description BLOOD SITE NOT SPECIFIED  Final   Special Requests   Final    BOTTLES DRAWN AEROBIC AND ANAEROBIC Blood Culture results may not be optimal due to an inadequate volume of blood received in culture bottles   Culture  Setup Time   Final    GRAM POSITIVE COCCI IN CLUSTERS IN BOTH AEROBIC AND ANAEROBIC BOTTLES CRITICAL RESULT CALLED TO, READ BACK BY AND VERIFIED WITH: PHARMD E Lacona [phone] AT 1420 BY CM    Culture (A)  Final    STAPHYLOCOCCUS HAEMOLYTICUS STAPHYLOCOCCUS EPIDERMIDIS THE SIGNIFICANCE OF ISOLATING THIS ORGANISM FROM A SINGLE VENIPUNCTURE CANNOT BE PREDICTED WITHOUT FURTHER CLINICAL AND CULTURE CORRELATION. SUSCEPTIBILITIES AVAILABLE ONLY ON REQUEST. Performed at [facility], [place] [address], [place], [place] [address]    Report Status 12/13/2019 FINAL  Final  Blood Culture ID Panel (Reflexed)     Status: Abnormal   Collection Time: 12/10/19  8:02 PM  Result Value Ref Range Status   Enterococcus faecalis NOT DETECTED NOT DETECTED Final   Enterococcus Faecium NOT DETECTED NOT DETECTED Final  Listeria monocytogenes NOT DETECTED NOT DETECTED Final   Staphylococcus species DETECTED (A) NOT DETECTED Final    Comment: CRITICAL RESULT CALLED TO, READ BACK BY AND VERIFIED WITH: pharmd e sullivan [phone] at 1420 by cm    Staphylococcus aureus (BCID) NOT DETECTED NOT DETECTED Final    Staphylococcus epidermidis DETECTED (A) NOT DETECTED Final    Comment: Methicillin (oxacillin) resistant coagulase negative staphylococcus. Possible blood culture contaminant (unless isolated from more than one blood culture draw or clinical case suggests pathogenicity). No antibiotic treatment is indicated for blood  culture contaminants. CRITICAL RESULT CALLED TO, READ BACK BY AND VERIFIED WITH: pharmd e sullivan [phone] at 1420 by cm    Staphylococcus lugdunensis NOT DETECTED NOT DETECTED Final   Streptococcus species NOT DETECTED NOT DETECTED Final   Streptococcus agalactiae NOT DETECTED NOT DETECTED Final   Streptococcus pneumoniae NOT DETECTED NOT DETECTED Final   Streptococcus pyogenes NOT DETECTED NOT DETECTED Final   A.calcoaceticus-baumannii NOT DETECTED NOT DETECTED Final   Bacteroides fragilis NOT DETECTED NOT DETECTED Final   Enterobacterales NOT DETECTED NOT DETECTED Final   Enterobacter cloacae complex NOT DETECTED NOT DETECTED Final   Escherichia coli NOT DETECTED NOT DETECTED Final   Klebsiella aerogenes NOT DETECTED NOT DETECTED Final   Klebsiella oxytoca NOT DETECTED NOT DETECTED Final   Klebsiella pneumoniae NOT DETECTED NOT DETECTED Final   Proteus species NOT DETECTED NOT DETECTED Final   Salmonella species NOT DETECTED NOT DETECTED Final   Serratia marcescens NOT DETECTED NOT DETECTED Final   Haemophilus influenzae NOT DETECTED NOT DETECTED Final   Neisseria meningitidis NOT DETECTED NOT DETECTED Final   Pseudomonas aeruginosa NOT DETECTED NOT DETECTED Final   Stenotrophomonas maltophilia NOT DETECTED NOT DETECTED Final   Candida albicans NOT DETECTED NOT DETECTED Final   Candida auris NOT DETECTED NOT DETECTED Final   Candida glabrata NOT DETECTED NOT DETECTED Final   Candida krusei NOT DETECTED NOT DETECTED Final   Candida parapsilosis NOT DETECTED NOT DETECTED Final   Candida tropicalis NOT DETECTED NOT DETECTED Final   Cryptococcus neoformans/gattii NOT  DETECTED NOT DETECTED Final   Methicillin resistance mecA/C DETECTED (A) NOT DETECTED Final    Comment: CRITICAL RESULT CALLED TO, READ BACK BY AND VERIFIED WITH: pharmd Joneen Caraway [phone] at 1420 by cm Performed at [facility] [place] [facility], [address] [place], [place] [address]      Medications:   . vitamin C  500 mg Oral Daily  . enoxaparin (LOVENOX) injection  55 mg Subcutaneous Q0600  . famotidine  20 mg Oral Daily  . insulin aspart  0-20 Units Subcutaneous TID WC  . insulin aspart  0-5 Units Subcutaneous QHS  . insulin aspart  10 Units Subcutaneous TID WC  . insulin detemir  20 Units Subcutaneous BID  . methylPREDNISolone (SOLU-MEDROL) injection  60 mg Intravenous Q12H  . sodium chloride flush  3 mL Intravenous Q12H  . zinc sulfate  220 mg Oral Daily   Continuous Infusions: . remdesivir 100 mg in NS 100 mL 100 mg (12/14/19 0912)      LOS: 4 days   Charlynne Cousins  [facility]  12/15/2019, 8:33 AM

## 2019-12-16 LAB — COMPREHENSIVE METABOLIC PANEL
ALT: 89 U/L — ABNORMAL HIGH (ref 0–44)
AST: 36 U/L (ref 15–41)
Albumin: 3.1 g/dL — ABNORMAL LOW (ref 3.5–5.0)
Alkaline Phosphatase: 40 U/L (ref 38–126)
Anion gap: 8 (ref 5–15)
BUN: 18 mg/dL (ref 6–20)
CO2: 27 mmol/L (ref 22–32)
Calcium: 8.4 mg/dL — ABNORMAL LOW (ref 8.9–10.3)
Chloride: 104 mmol/L (ref 98–111)
Creatinine, Ser: 0.97 mg/dL (ref 0.61–1.24)
GFR calc Af Amer: >60 mL/min (ref >60)
GFR calc non Af Amer: >60 mL/min (ref >60)
Glucose, Bld: 111 mg/dL — ABNORMAL HIGH (ref 70–99)
Potassium: 3.6 mmol/L (ref 3.5–5.1)
Sodium: 139 mmol/L (ref 135–145)
Total Bilirubin: 1.1 mg/dL (ref 0.3–1.2)
Total Protein: 6.4 g/dL — ABNORMAL LOW (ref 6.5–8.1)

## 2019-12-16 LAB — CBC WITH DIFFERENTIAL/PLATELET
Abs Immature Granulocytes: 0.3 10*3/uL — ABNORMAL HIGH (ref 0.00–0.07)
Basophils Absolute: 0.1 10*3/uL (ref 0.0–0.1)
Basophils Relative: 0 %
Eosinophils Absolute: 0 10*3/uL (ref 0.0–0.5)
Eosinophils Relative: 0 %
HCT: 42.9 % (ref 39.0–52.0)
Hemoglobin: 14.2 g/dL (ref 13.0–17.0)
Immature Granulocytes: 3 %
Lymphocytes Relative: 20 %
Lymphs Abs: 2.3 10*3/uL (ref 0.7–4.0)
MCH: 25.9 pg — ABNORMAL LOW (ref 26.0–34.0)
MCHC: 33.1 g/dL (ref 30.0–36.0)
MCV: 78.3 fL — ABNORMAL LOW (ref 80.0–100.0)
Monocytes Absolute: 0.9 10*3/uL (ref 0.1–1.0)
Monocytes Relative: 8 %
Neutro Abs: 8 10*3/uL — ABNORMAL HIGH (ref 1.7–7.7)
Neutrophils Relative %: 69 %
Platelets: 442 10*3/uL — ABNORMAL HIGH (ref 150–400)
RBC: 5.48 MIL/uL (ref 4.22–5.81)
RDW: 14.5 % (ref 11.5–15.5)
WBC: 11.6 10*3/uL — ABNORMAL HIGH (ref 4.0–10.5)
nRBC: 0 % (ref 0.0–0.2)

## 2019-12-16 LAB — MAGNESIUM: Magnesium: 2.1 mg/dL (ref 1.7–2.4)

## 2019-12-16 LAB — GLUCOSE, CAPILLARY: Glucose-Capillary: 113 mg/dL — ABNORMAL HIGH (ref 70–99)

## 2019-12-16 LAB — C-REACTIVE PROTEIN: CRP: 0.5 mg/dL (ref <1.0)

## 2019-12-16 LAB — D-DIMER, QUANTITATIVE: D-Dimer, Quant: 1.11 ug/mL-FEU — ABNORMAL HIGH (ref 0.00–0.50)

## 2019-12-16 MED ORDER — METFORMIN HCL 500 MG PO TABS
500.0000 mg | ORAL_TABLET | Freq: Two times a day (BID) | ORAL | 3 refills | Status: DC
Start: 2019-12-16 — End: 2020-07-21

## 2019-12-16 NOTE — Discharge Summary (Signed)
Physician Discharge Summary  Mitchell Steele MWU:[id number] DOB: 01-Nov-1995 DOA: 12/10/2019  PCP: Patient, No Pcp Per  Admit date: 12/10/2019 Discharge date: 12/16/2019  Admitted From: Home Disposition:  Home  Recommendations for Outpatient Follow-up:  1. Follow up with PCP in 1-2 weeks 2. Please obtain BMP/CBC in one week   Home Health:No Equipment/Devices:None  Discharge Condition:Stable CODE STATUS:Full Diet recommendation: Heart Healthy   Brief/Interim Summary:  24 y.o. male past medical history of obesity comes into the emergency room room for shortness of breath.  She relates that about 4 to 5 days prior to admission she started getting a sore throat generalized weakness and fatigue she tested positive for SARS-CoV-2, she was scheduled to receive antimonoclonal antibodies but was unable to reach her appointment comes in today with fever shortness of breath nausea and vomiting.  Discharge Diagnoses:  Active Problems:   Obesity (BMI 30.0-34.9)   Pneumonia due to COVID-19 virus   Diabetes mellitus type 2, controlled, without complications ([place])   Hyponatremia   Acute respiratory failure with hypoxia (HCC)  Acute respiratory failure with hypoxia secondary to COVID-19 pneumonia: On admission he was started on supplemental oxygen 5 L IV remdesivir and steroids he slowly was weaned to room air, he complete his course of IV remdesivir and steroids in house. He was discharged in stable condition he was ambulated and saturations remained greater than 94% on room air with ambulation.  Newly diagnosed diabetes mellitus type 2 without complications: Initially he was on steroids in the hospital covered with long-acting insulin plus sliding scale, now he has been transitioned to oral Metformin which she will continue as an outpatient.  Acute kidney injury: Likely prerenal azotemia in the setting of COVID-19 with intractable nausea and vomiting resolved with IV fluid hydration.  Hypovolemic  hyponatremia: Resolved with IV fluid hydration.  Hypokalemia: Replete orally now resolved.  Elevated LFTs: LFTs have improved, with conservative management.  Likely due to COVID-19 infection.  Morbid obesity with a BMI greater than 40: Counseling was performed.  Discharge Instructions  Discharge Instructions    Diet - low sodium heart healthy   Complete by: As directed    Increase activity slowly   Complete by: As directed      Allergies as of 12/16/2019   No Known Allergies     Medication List    STOP taking these medications   benzonatate 100 MG capsule Commonly known as: TESSALON   fluticasone 50 MCG/ACT nasal spray Commonly known as: FLONASE   ipratropium 0.06 % nasal spray Commonly known as: Atrovent   prochlorperazine 10 MG tablet Commonly known as: COMPAZINE     TAKE these medications   ibuprofen 800 MG tablet Commonly known as: ADVIL Take 1 tablet (800 mg total) by mouth 3 (three) times daily. What changed:   when to take this  reasons to take this   metFORMIN 500 MG tablet Commonly known as: GLUCOPHAGE Take 1 tablet (500 mg total) by mouth 2 (two) times daily with a meal.       Follow-up Information    POST-COVID [place]. Go on 12/27/2019.   Why: 3:30 pm Contact information: [place] [id number] [place]. Schedule an appointment as soon as possible for a visit.   Contact information: [address] [place] [address] [phone]             No Known Allergies  Consultations:  NOne   Procedures/Studies: CT Angio Chest PE W and/or Wo Contrast  Result Date: 12/10/2019 CLINICAL DATA:  PE suspected, low/intermediate prob, positive D-dimer COVID positive with increasing shortness of breath. EXAM: CT ANGIOGRAPHY CHEST WITH CONTRAST TECHNIQUE: Multidetector CT imaging of the chest was performed using the  standard protocol during bolus administration of intravenous contrast. Multiplanar CT image reconstructions and MIPs were obtained to evaluate the vascular anatomy. CONTRAST:  43mL OMNIPAQUE IOHEXOL 350 MG/ML SOLN COMPARISON:  Radiograph earlier this day. FINDINGS: Cardiovascular: There are no filling defects within the pulmonary arteries to suggest pulmonary embolus. The thoracic aorta is normal in caliber. No aortic dissection. Heart is normal in size. No pericardial effusion. Mediastinum/Nodes: Patulous distal esophagus. No esophageal wall thickening. Scattered mediastinal and hilar nodes, largest prevascular measuring 11 mm, likely reactive. No suspicious thyroid nodule. No pneumomediastinum. Lungs/Pleura: Patchy bilateral ground-glass opacities, left greater than right. No pleural fluid or pneumothorax. Trachea and central bronchi are patent. Upper Abdomen: No acute findings.  Suspected hepatic steatosis. Musculoskeletal: Left gynecomastia. There are no acute or suspicious osseous abnormalities. Review of the MIP images confirms the above findings. IMPRESSION: 1. No pulmonary embolus. 2. Patchy bilateral ground-glass opacities, left greater than right, consistent with COVID-19 pneumonia. Parenchymal involvement is moderate. Electronically Signed   By: Keith Rake M.D.   On: 12/10/2019 23:12   DG Chest Portable 1 View  Result Date: 12/10/2019 CLINICAL DATA:  SOB/COVID+(09/02); patient unvaccinated. Smoker. EXAM: PORTABLE CHEST 1 VIEW COMPARISON:  12/06/2019 FINDINGS: Hazy bilateral airspace lung opacities have developed since the prior chest radiograph, predominantly in the lung bases and mid lungs. No evidence of pulmonary edema. No pleural effusion or pneumothorax. Cardiac silhouette normal in size.  No mediastinal or hilar masses. Skeletal structures are grossly intact. IMPRESSION: 1. Hazy bilateral airspace lung opacities consistent with multifocal COVID-19 pneumonia, developing since previous chest  radiograph. Electronically Signed   By: Lajean Manes M.D.   On: 12/10/2019 18:10   DG Chest Portable 1 View  Result Date: 12/06/2019 CLINICAL DATA:  Shortness of breath EXAM: PORTABLE CHEST 1 VIEW COMPARISON:  None. FINDINGS: Artifact overlies the chest. Heart size is normal. Mediastinal shadows are normal. The lungs are clear. The vascularity is normal. No effusions. No abnormal bone finding. IMPRESSION: No active disease. Electronically Signed   By: Nelson Chimes M.D.   On: 12/06/2019 14:48      Subjective: No complaints. Discharge Exam: Vitals:   12/15/19 2334 12/16/19 0441  BP: 110/61 92/61  Pulse: 83 93  Resp: (!) 21 18  Temp: 98.9 F (37.2 C) 98.7 F (37.1 C)  SpO2: 97% 95%   Vitals:   12/15/19 1344 12/15/19 1947 12/15/19 2334 12/16/19 0441  BP: (!) 141/72 108/66 110/61 92/61  Pulse: (!) 107 90 83 93  Resp: 20 20 (!) 21 18  Temp: 98.7 F (37.1 C) 98.6 F (37 C) 98.9 F (37.2 C) 98.7 F (37.1 C)  TempSrc: Oral Axillary Axillary Oral  SpO2: (!) 85% 90% 97% 95%  Weight:      Height:        General: Pt is alert, awake, not in acute distress Cardiovascular: RRR, S1/S2 +, no rubs, no gallops Respiratory: CTA bilaterally, no wheezing, no rhonchi Abdominal: Soft, NT, ND, bowel sounds + Extremities: no edema, no cyanosis    The results of significant diagnostics from this hospitalization (including imaging, microbiology, ancillary and laboratory) are listed below for reference.     Microbiology: Recent Results (from the past 240 hour(s))  SARS  Coronavirus 2 by RT PCR (hospital order, performed in [facility] hospital lab) Nasopharyngeal Nasopharyngeal Swab     Status: Abnormal   Collection Time: 12/06/19  9:56 AM   Specimen: Nasopharyngeal Swab  Result Value Ref Range Status   SARS Coronavirus 2 POSITIVE (A) NEGATIVE Final    Comment: emailed L. Berdik RN 11:30 12/06/19 ([email or web address]) (NOTE) SARS-CoV-2 target nucleic acids are DETECTED  SARS-CoV-2 RNA is generally  detectable in upper respiratory specimens  during the acute phase of infection.  Positive results are indicative  of the presence of the identified virus, but do not rule out bacterial infection or co-infection with other pathogens not detected by the test.  Clinical correlation with patient history and  other diagnostic information is necessary to determine patient infection status.  The expected result is negative.  Fact Sheet for Patients:   StrictlyIdeas.no   Fact Sheet for Healthcare Providers:   BankingDealers.co.za    This test is not yet approved or cleared by the [place] FDA and  has been authorized for detection and/or diagnosis of SARS-CoV-2 by FDA under an Emergency Use Authorization (EUA).  This EUA will remain in effect (meaning this test can be used) for the duration of  the  COVID-19 declaration under Section 564(b)(1) of the Act, 21 U.S.C. section 360-bbb-3(b)(1), unless the authorization is terminated or revoked sooner.  Performed at [facility], [place] [address], [place], [place] [address]   Blood Culture (routine x 2)     Status: Abnormal   Collection Time: 12/10/19  8:02 PM   Specimen: BLOOD  Result Value Ref Range Status   Specimen Description BLOOD SITE NOT SPECIFIED  Final   Special Requests   Final    BOTTLES DRAWN AEROBIC AND ANAEROBIC Blood Culture results may not be optimal due to an inadequate volume of blood received in culture bottles   Culture  Setup Time   Final    GRAM POSITIVE COCCI IN CLUSTERS IN BOTH AEROBIC AND ANAEROBIC BOTTLES CRITICAL RESULT CALLED TO, READ BACK BY AND VERIFIED WITH: PHARMD E [place] [phone] AT 1420 BY CM    Culture (A)  Final    STAPHYLOCOCCUS HAEMOLYTICUS STAPHYLOCOCCUS EPIDERMIDIS THE SIGNIFICANCE OF ISOLATING THIS ORGANISM FROM A SINGLE VENIPUNCTURE CANNOT BE PREDICTED WITHOUT FURTHER CLINICAL AND CULTURE CORRELATION. SUSCEPTIBILITIES AVAILABLE ONLY ON  REQUEST. Performed at [facility], [place] [address], [place], [place] [address]    Report Status 12/13/2019 FINAL  Final  Blood Culture ID Panel (Reflexed)     Status: Abnormal   Collection Time: 12/10/19  8:02 PM  Result Value Ref Range Status   Enterococcus faecalis NOT DETECTED NOT DETECTED Final   Enterococcus Faecium NOT DETECTED NOT DETECTED Final   Listeria monocytogenes NOT DETECTED NOT DETECTED Final   Staphylococcus species DETECTED (A) NOT DETECTED Final    Comment: CRITICAL RESULT CALLED TO, READ BACK BY AND VERIFIED WITH: pharmd e sullivan [phone] at 1420 by cm    Staphylococcus aureus (BCID) NOT DETECTED NOT DETECTED Final   Staphylococcus epidermidis DETECTED (A) NOT DETECTED Final    Comment: Methicillin (oxacillin) resistant coagulase negative staphylococcus. Possible blood culture contaminant (unless isolated from more than one blood culture draw or clinical case suggests pathogenicity). No antibiotic treatment is indicated for blood  culture contaminants. CRITICAL RESULT CALLED TO, READ BACK BY AND VERIFIED WITH: pharmd e [place] [phone] at 1420 by cm    Staphylococcus lugdunensis NOT DETECTED NOT DETECTED Final   Streptococcus species NOT DETECTED NOT  DETECTED Final   Streptococcus agalactiae NOT DETECTED NOT DETECTED Final   Streptococcus pneumoniae NOT DETECTED NOT DETECTED Final   Streptococcus pyogenes NOT DETECTED NOT DETECTED Final   A.calcoaceticus-baumannii NOT DETECTED NOT DETECTED Final   Bacteroides fragilis NOT DETECTED NOT DETECTED Final   Enterobacterales NOT DETECTED NOT DETECTED Final   Enterobacter cloacae complex NOT DETECTED NOT DETECTED Final   Escherichia coli NOT DETECTED NOT DETECTED Final   Klebsiella aerogenes NOT DETECTED NOT DETECTED Final   Klebsiella oxytoca NOT DETECTED NOT DETECTED Final   Klebsiella pneumoniae NOT DETECTED NOT DETECTED Final   Proteus species NOT DETECTED NOT DETECTED Final   Salmonella species NOT DETECTED  NOT DETECTED Final   Serratia marcescens NOT DETECTED NOT DETECTED Final   Haemophilus influenzae NOT DETECTED NOT DETECTED Final   Neisseria meningitidis NOT DETECTED NOT DETECTED Final   Pseudomonas aeruginosa NOT DETECTED NOT DETECTED Final   Stenotrophomonas maltophilia NOT DETECTED NOT DETECTED Final   Candida albicans NOT DETECTED NOT DETECTED Final   Candida auris NOT DETECTED NOT DETECTED Final   Candida glabrata NOT DETECTED NOT DETECTED Final   Candida krusei NOT DETECTED NOT DETECTED Final   Candida parapsilosis NOT DETECTED NOT DETECTED Final   Candida tropicalis NOT DETECTED NOT DETECTED Final   Cryptococcus neoformans/gattii NOT DETECTED NOT DETECTED Final   Methicillin resistance mecA/C DETECTED (A) NOT DETECTED Final    Comment: CRITICAL RESULT CALLED TO, READ BACK BY AND VERIFIED WITH: pharmd Joneen Caraway [phone] at 1420 by cm Performed at [facility], [address] [place], [place] [address]      Labs: BNP (last 3 results) No results for input(s): BNP in the last 8760 hours. Basic Metabolic Panel: Recent Labs  Lab 12/12/19 0439 12/13/19 0729 12/14/19 0736 12/15/19 0320 12/16/19 0431  NA 139 142 142 141 139  K 4.0 4.0 4.1 4.4 3.6  CL 102 105 105 103 104  CO2 30 27 28 30 27   GLUCOSE 175* 171* 176* 159* 111*  BUN 15 15 17 19 18   CREATININE 0.92 0.88 0.96 0.94 0.97  CALCIUM 8.8* 8.9 9.0 8.9 8.4*  MG 2.3 2.5* 2.5* 2.4 2.1   Liver Function Tests: Recent Labs  Lab 12/12/19 0439 12/13/19 0729 12/14/19 0736 12/15/19 0320 12/16/19 0431  AST 49* 86* 63* 35 36  ALT 44 83* 109* 93* 89*  ALKPHOS 36* 34* 38 39 40  BILITOT 0.3 1.1 1.0 0.9 1.1  PROT 6.7 7.1 7.3 6.6 6.4*  ALBUMIN 3.1* 3.2* 3.4* 3.3* 3.1*   No results for input(s): LIPASE, AMYLASE in the last 168 hours. No results for input(s): AMMONIA in the last 168 hours. CBC: Recent Labs  Lab 12/12/19 0439 12/13/19 0729 12/14/19 0736 12/15/19 0320 12/16/19 0431  WBC 4.4 4.6 7.0 8.4 11.6*   NEUTROABS 3.2 3.4 5.4 6.2 8.0*  HGB 14.1 14.3 14.3 14.4 14.2  HCT 43.7 44.0 44.4 43.9 42.9  MCV 79.0* 79.4* 78.9* 79.1* 78.3*  PLT 253 303 340 396 442*   Cardiac Enzymes: No results for input(s): CKTOTAL, CKMB, CKMBINDEX, TROPONINI in the last 168 hours. BNP: Invalid input(s): POCBNP CBG: Recent Labs  Lab 12/15/19 0745 12/15/19 1208 12/15/19 1635 12/15/19 2039 12/16/19 0743  GLUCAP 195* 229* 119* 99 113*   D-Dimer Recent Labs    12/15/19 0320 12/16/19 0431  DDIMER 1.04* 1.11*   Hgb A1c No results for input(s): HGBA1C in the last 72 hours. Lipid Profile No results for input(s): CHOL, HDL, LDLCALC, TRIG, CHOLHDL, LDLDIRECT in  the last 72 hours. Thyroid function studies No results for input(s): TSH, T4TOTAL, T3FREE, THYROIDAB in the last 72 hours.  Invalid input(s): FREET3 Anemia work up No results for input(s): VITAMINB12, FOLATE, FERRITIN, TIBC, IRON, RETICCTPCT in the last 72 hours. Urinalysis    Component Value Date/Time   COLORURINE YELLOW 02/04/2012 1308   APPEARANCEUR CLEAR 02/04/2012 1308   LABSPEC 1.010 02/04/2012 1308   PHURINE 6.5 02/04/2012 1308   GLUCOSEU NEGATIVE 02/04/2012 1308   HGBUR NEGATIVE 02/04/2012 1308   BILIRUBINUR NEGATIVE 02/04/2012 1308   KETONESUR NEGATIVE 02/04/2012 1308   PROTEINUR NEGATIVE 02/04/2012 1308   UROBILINOGEN 0.2 02/04/2012 1308   NITRITE NEGATIVE 02/04/2012 1308   LEUKOCYTESUR TRACE (A) 02/04/2012 1308   Sepsis Labs Invalid input(s): PROCALCITONIN,  WBC,  LACTICIDVEN Microbiology Recent Results (from the past 240 hour(s))  SARS Coronavirus 2 by RT PCR (hospital order, performed in [place] hospital lab) Nasopharyngeal Nasopharyngeal Swab     Status: Abnormal   Collection Time: 12/06/19  9:56 AM   Specimen: Nasopharyngeal Swab  Result Value Ref Range Status   SARS Coronavirus 2 POSITIVE (A) NEGATIVE Final    Comment: emailed L. Berdik RN 11:30 12/06/19 ([email or web address]) (NOTE) SARS-CoV-2 target nucleic acids are  DETECTED  SARS-CoV-2 RNA is generally detectable in upper respiratory specimens  during the acute phase of infection.  Positive results are indicative  of the presence of the identified virus, but do not rule out bacterial infection or co-infection with other pathogens not detected by the test.  Clinical correlation with patient history and  other diagnostic information is necessary to determine patient infection status.  The expected result is negative.  Fact Sheet for Patients:   StrictlyIdeas.no   Fact Sheet for Healthcare Providers:   BankingDealers.co.za    This test is not yet approved or cleared by the [place] FDA and  has been authorized for detection and/or diagnosis of SARS-CoV-2 by FDA under an Emergency Use Authorization (EUA).  This EUA will remain in effect (meaning this test can be used) for the duration of  the  COVID-19 declaration under Section 564(b)(1) of the Act, 21 U.S.C. section 360-bbb-3(b)(1), unless the authorization is terminated or revoked sooner.  Performed at [facility], [place] [address] [place], [place] [address]   Blood Culture (routine x 2)     Status: Abnormal   Collection Time: 12/10/19  8:02 PM   Specimen: BLOOD  Result Value Ref Range Status   Specimen Description BLOOD SITE NOT SPECIFIED  Final   Special Requests   Final    BOTTLES DRAWN AEROBIC AND ANAEROBIC Blood Culture results may not be optimal due to an inadequate volume of blood received in culture bottles   Culture  Setup Time   Final    GRAM POSITIVE COCCI IN CLUSTERS IN BOTH AEROBIC AND ANAEROBIC BOTTLES CRITICAL RESULT CALLED TO, READ BACK BY AND VERIFIED WITH: PHARMD E [place] [phone] AT 1420 BY CM    Culture (A)  Final    STAPHYLOCOCCUS HAEMOLYTICUS STAPHYLOCOCCUS EPIDERMIDIS THE SIGNIFICANCE OF ISOLATING THIS ORGANISM FROM A SINGLE VENIPUNCTURE CANNOT BE PREDICTED WITHOUT FURTHER CLINICAL AND CULTURE CORRELATION.  SUSCEPTIBILITIES AVAILABLE ONLY ON REQUEST. Performed at [facility], [place] [address], [place], [place] [address]    Report Status 12/13/2019 FINAL  Final  Blood Culture ID Panel (Reflexed)     Status: Abnormal   Collection Time: 12/10/19  8:02 PM  Result Value Ref Range Status   Enterococcus faecalis NOT DETECTED NOT DETECTED  Final   Enterococcus Faecium NOT DETECTED NOT DETECTED Final   Listeria monocytogenes NOT DETECTED NOT DETECTED Final   Staphylococcus species DETECTED (A) NOT DETECTED Final    Comment: CRITICAL RESULT CALLED TO, READ BACK BY AND VERIFIED WITH: pharmd e sullivan [phone] at 1420 by cm    Staphylococcus aureus (BCID) NOT DETECTED NOT DETECTED Final   Staphylococcus epidermidis DETECTED (A) NOT DETECTED Final    Comment: Methicillin (oxacillin) resistant coagulase negative staphylococcus. Possible blood culture contaminant (unless isolated from more than one blood culture draw or clinical case suggests pathogenicity). No antibiotic treatment is indicated for blood  culture contaminants. CRITICAL RESULT CALLED TO, READ BACK BY AND VERIFIED WITH: pharmd e sullivan [phone] at 1420 by cm    Staphylococcus lugdunensis NOT DETECTED NOT DETECTED Final   Streptococcus species NOT DETECTED NOT DETECTED Final   Streptococcus agalactiae NOT DETECTED NOT DETECTED Final   Streptococcus pneumoniae NOT DETECTED NOT DETECTED Final   Streptococcus pyogenes NOT DETECTED NOT DETECTED Final   A.calcoaceticus-baumannii NOT DETECTED NOT DETECTED Final   Bacteroides fragilis NOT DETECTED NOT DETECTED Final   Enterobacterales NOT DETECTED NOT DETECTED Final   Enterobacter cloacae complex NOT DETECTED NOT DETECTED Final   Escherichia coli NOT DETECTED NOT DETECTED Final   Klebsiella aerogenes NOT DETECTED NOT DETECTED Final   Klebsiella oxytoca NOT DETECTED NOT DETECTED Final   Klebsiella pneumoniae NOT DETECTED NOT DETECTED Final   Proteus species NOT DETECTED NOT DETECTED Final    Salmonella species NOT DETECTED NOT DETECTED Final   Serratia marcescens NOT DETECTED NOT DETECTED Final   Haemophilus influenzae NOT DETECTED NOT DETECTED Final   Neisseria meningitidis NOT DETECTED NOT DETECTED Final   Pseudomonas aeruginosa NOT DETECTED NOT DETECTED Final   Stenotrophomonas maltophilia NOT DETECTED NOT DETECTED Final   Candida albicans NOT DETECTED NOT DETECTED Final   Candida auris NOT DETECTED NOT DETECTED Final   Candida glabrata NOT DETECTED NOT DETECTED Final   Candida krusei NOT DETECTED NOT DETECTED Final   Candida parapsilosis NOT DETECTED NOT DETECTED Final   Candida tropicalis NOT DETECTED NOT DETECTED Final   Cryptococcus neoformans/gattii NOT DETECTED NOT DETECTED Final   Methicillin resistance mecA/C DETECTED (A) NOT DETECTED Final    Comment: CRITICAL RESULT CALLED TO, READ BACK BY AND VERIFIED WITH: pharmd Joneen Caraway [phone] at 1420 by cm Performed at [facility], [address] [place], [place] [address]      Time coordinating discharge: Over 40 minutes  SIGNED:   Charlynne Cousins, MD  [facility] 12/16/2019, 9:51 AM Pager   If 7PM-7AM, please contact night-coverage [email or web address] Password [email or web address]

## 2019-12-16 NOTE — Progress Notes (Addendum)
Patient was discharged home by MD order; discharged instructions  review and give to patient with care notes, prescription and letter for work; IV DIC; skin intact; patient will be escorted to the car by nurse tech via wheelchair.

## 2019-12-16 NOTE — Discharge Instructions (Signed)
Mitchell Steele was admitted to the Hospital on 12/10/2019 and Discharged on Discharge Date 12/16/2019 and should be excused from work/school  for 7  days starting 12/10/2019 , may return to work/school without any restrictions.  He needs to stay in isolation at home for 21 days starting 9.6.2021   Call Bess Harvest MD, [facility] [phone] with questions.  Charlynne Cousins M.D on 12/16/2019,at 12:41 PM  [facility] Office  [phone]

## 2019-12-27 ENCOUNTER — Other Ambulatory Visit: Payer: Self-pay

## 2019-12-27 ENCOUNTER — Ambulatory Visit
Admission: RE | Admit: 2019-12-27 | Discharge: 2019-12-27 | Disposition: A | Discharge status: Home / Self Care | Payer: Medicaid Other | Source: Ambulatory Visit | Attending: Nurse Practitioner | Admitting: Nurse Practitioner

## 2019-12-27 ENCOUNTER — Ambulatory Visit (INDEPENDENT_AMBULATORY_CARE_PROVIDER_SITE_OTHER): Payer: HRSA Program | Admitting: Nurse Practitioner

## 2019-12-27 VITALS — BP 130/88 | HR 112 | Temp 97.7°F | Wt 270.0 lb

## 2019-12-27 DIAGNOSIS — E119 Type 2 diabetes mellitus without complications: Secondary | ICD-10-CM

## 2019-12-27 DIAGNOSIS — U071 COVID-19: Secondary | ICD-10-CM

## 2019-12-27 DIAGNOSIS — J1282 Pneumonia due to coronavirus disease 2019: Secondary | ICD-10-CM | POA: Diagnosis not present

## 2019-12-27 NOTE — Progress Notes (Signed)
@Patient  ID: Mitchell Steele, male    DOB: 12/15/1995, 24 y.o.   MRN: [id number]  Chief Complaint  Patient presents with  . Hospitalization Follow-up    COVID Pos: 9/2 Hosp: 9/6-9/12 No sx feeling better    Referring provider: No ref. provider found   24 year old male with history of migraine, allergic rhinitis, eczema, obesity.  Diagnosed with Covid on 12/06/2019.  Newly diagnosed with diabetes during recent hospitalization.  HPI  Patient presents today for post COVID care clinic visit.  He was diagnosed with Covid on 12/06/2019.  He was hospitalized from 12/10/2019 through 12/16/2019 for Covid pneumonia.  He was treated with remdesivir, oxygen, and steroids.  He was discharged home without oxygen.  Patient was newly diagnosed with diabetes while in the hospital.  He does not currently have a PCP.  We will get him set up with an appointment to establish care with a primary care provider during her visit today.  He does need repeat labs and chest x-ray today.  Patient states that overall he has been doing well since hospital discharge.  He states that he is back to his baseline.  He denies any significant shortness of breath or any significant cough.Denies f/c/s, n/v/d, hemoptysis, PND, chest pain or edema.     No Known Allergies  Immunization History  Administered Date(s) Administered  . HPV Quadrivalent 04/03/2012  . Influenza, Seasonal, Injecte, Preservative Fre 04/03/2012    Past Medical History:  Diagnosis Date  . Eczema   . Migraine   . Obesity     Tobacco History: Social History   Tobacco Use  Smoking Status Current Every Day Smoker  . Packs/day: 0.00  . Types: Cigars, Cigarettes  Smokeless Tobacco Never Used   Ready to quit: No Counseling given: Yes   Outpatient Encounter Medications as of 12/27/2019  Medication Sig  . ibuprofen (ADVIL,MOTRIN) 800 MG tablet Take 1 tablet (800 mg total) by mouth 3 (three) times daily. (Patient taking differently: Take 800 mg by mouth  every 8 (eight) hours as needed for mild pain. )  . metFORMIN (GLUCOPHAGE) 500 MG tablet Take 1 tablet (500 mg total) by mouth 2 (two) times daily with a meal.   No facility-administered encounter medications on file as of 12/27/2019.     Review of Systems  Review of Systems  Constitutional: Negative.  Negative for fatigue and fever.  HENT: Negative.   Respiratory: Negative for cough and shortness of breath.   Cardiovascular: Negative.  Negative for chest pain, palpitations and leg swelling.  Gastrointestinal: Negative.   Allergic/Immunologic: Negative.   Neurological: Negative.   Psychiatric/Behavioral: Negative.        Physical Exam  BP 130/88 (BP Location: Left Arm)   Pulse (!) 112   Temp 97.7 F (36.5 C)   Wt 270 lb (122.5 kg)   SpO2 95%   BMI 45.81 kg/m   Wt Readings from Last 5 Encounters:  12/27/19 270 lb (122.5 kg)  12/11/19 250 lb (113.4 kg)  04/01/17 215 lb (97.5 kg)  05/19/16 220 lb (99.8 kg)  10/07/15 236 lb (107 kg)     Physical Exam Vitals and nursing note reviewed.  Constitutional:      General: He is not in acute distress.    Appearance: He is well-developed.  Cardiovascular:     Rate and Rhythm: Normal rate and regular rhythm.  Pulmonary:     Effort: Pulmonary effort is normal.     Breath sounds: Normal breath sounds.  Skin:  General: Skin is warm and dry.  Neurological:     Mental Status: He is alert and oriented to person, place, and time.     Imaging: CT Angio Chest PE W and/or Wo Contrast  Result Date: 12/10/2019 CLINICAL DATA:  PE suspected, low/intermediate prob, positive D-dimer COVID positive with increasing shortness of breath. EXAM: CT ANGIOGRAPHY CHEST WITH CONTRAST TECHNIQUE: Multidetector CT imaging of the chest was performed using the standard protocol during bolus administration of intravenous contrast. Multiplanar CT image reconstructions and MIPs were obtained to evaluate the vascular anatomy. CONTRAST:  81mL OMNIPAQUE  IOHEXOL 350 MG/ML SOLN COMPARISON:  Radiograph earlier this day. FINDINGS: Cardiovascular: There are no filling defects within the pulmonary arteries to suggest pulmonary embolus. The thoracic aorta is normal in caliber. No aortic dissection. Heart is normal in size. No pericardial effusion. Mediastinum/Nodes: Patulous distal esophagus. No esophageal wall thickening. Scattered mediastinal and hilar nodes, largest prevascular measuring 11 mm, likely reactive. No suspicious thyroid nodule. No pneumomediastinum. Lungs/Pleura: Patchy bilateral ground-glass opacities, left greater than right. No pleural fluid or pneumothorax. Trachea and central bronchi are patent. Upper Abdomen: No acute findings.  Suspected hepatic steatosis. Musculoskeletal: Left gynecomastia. There are no acute or suspicious osseous abnormalities. Review of the MIP images confirms the above findings. IMPRESSION: 1. No pulmonary embolus. 2. Patchy bilateral ground-glass opacities, left greater than right, consistent with COVID-19 pneumonia. Parenchymal involvement is moderate. Electronically Signed   By: Keith Rake M.D.   On: 12/10/2019 23:12   DG Chest Portable 1 View  Result Date: 12/10/2019 CLINICAL DATA:  SOB/COVID+(09/02); patient unvaccinated. Smoker. EXAM: PORTABLE CHEST 1 VIEW COMPARISON:  12/06/2019 FINDINGS: Hazy bilateral airspace lung opacities have developed since the prior chest radiograph, predominantly in the lung bases and mid lungs. No evidence of pulmonary edema. No pleural effusion or pneumothorax. Cardiac silhouette normal in size.  No mediastinal or hilar masses. Skeletal structures are grossly intact. IMPRESSION: 1. Hazy bilateral airspace lung opacities consistent with multifocal COVID-19 pneumonia, developing since previous chest radiograph. Electronically Signed   By: Lajean Manes M.D.   On: 12/10/2019 18:10   DG Chest Portable 1 View  Result Date: 12/06/2019 CLINICAL DATA:  Shortness of breath EXAM: PORTABLE  CHEST 1 VIEW COMPARISON:  None. FINDINGS: Artifact overlies the chest. Heart size is normal. Mediastinal shadows are normal. The lungs are clear. The vascularity is normal. No effusions. No abnormal bone finding. IMPRESSION: No active disease. Electronically Signed   By: Nelson Chimes M.D.   On: 12/06/2019 14:48     Assessment & Plan:   Pneumonia due to COVID-19 virus Stay well hydrated  Stay active  Deep breathing exercises  May start vitamin C 2,000 mg daily, vitamin D3 2,000 IU daily, Zinc 220 mg daily, and Quercetin 500 mg twice daily  May take tylenol or fever or pain  May take mucinex DM twice daily  Will order chest x ray  Will order repeat labs   Diabetes:  Will set up apopintment to establish care with PCP to follow diabetes - will need hgb A1C checked   Follow up:  Follow up in 2 weeks or sooner if needed      Fenton Foy, NP 12/27/2019

## 2019-12-27 NOTE — Assessment & Plan Note (Signed)
Stay well hydrated  Stay active  Deep breathing exercises  May start vitamin C 2,000 mg daily, vitamin D3 2,000 IU daily, Zinc 220 mg daily, and Quercetin 500 mg twice daily  May take tylenol or fever or pain  May take mucinex DM twice daily  Will order chest x ray  Will order repeat labs   Diabetes:  Will set up apopintment to establish care with PCP to follow diabetes - will need hgb A1C checked   Follow up:  Follow up in 2 weeks or sooner if needed

## 2019-12-27 NOTE — Patient Instructions (Addendum)
Covid 19:   Stay well hydrated  Stay active  Deep breathing exercises  May start vitamin C 2,000 mg daily, vitamin D3 2,000 IU daily, Zinc 220 mg daily, and Quercetin 500 mg twice daily  May take tylenol or fever or pain  May take mucinex DM twice daily  Will order chest x ray  Will order repeat labs   Diabetes:  Will set up apopintment to establish care with PCP to follow diabetes - will need hgb A1C checked   Follow up:  Follow up in 2 weeks or sooner if needed

## 2019-12-28 LAB — COMPREHENSIVE METABOLIC PANEL
ALT: 67 IU/L — ABNORMAL HIGH (ref 0–44)
AST: 27 IU/L (ref 0–40)
Albumin/Globulin Ratio: 1.6 (ref 1.2–2.2)
Albumin: 4.4 g/dL (ref 4.1–5.2)
Alkaline Phosphatase: 66 IU/L (ref 44–121)
BUN/Creatinine Ratio: 7 — ABNORMAL LOW (ref 9–20)
BUN: 6 mg/dL (ref 6–20)
Bilirubin Total: 0.7 mg/dL (ref 0.0–1.2)
CO2: 25 mmol/L (ref 20–29)
Calcium: 9.2 mg/dL (ref 8.7–10.2)
Chloride: 103 mmol/L (ref 96–106)
Creatinine, Ser: 0.83 mg/dL (ref 0.76–1.27)
GFR calc Af Amer: 142 mL/min/{1.73_m2} (ref >59)
GFR calc non Af Amer: 123 mL/min/{1.73_m2} (ref >59)
Globulin, Total: 2.8 g/dL (ref 1.5–4.5)
Glucose: 83 mg/dL (ref 65–99)
Potassium: 4.4 mmol/L (ref 3.5–5.2)
Sodium: 142 mmol/L (ref 134–144)
Total Protein: 7.2 g/dL (ref 6.0–8.5)

## 2019-12-28 LAB — CBC
Hematocrit: 42.3 % (ref 37.5–51.0)
Hemoglobin: 14.5 g/dL (ref 13.0–17.7)
MCH: 26.9 pg (ref 26.6–33.0)
MCHC: 34.3 g/dL (ref 31.5–35.7)
MCV: 79 fL (ref 79–97)
Platelets: 286 10*3/uL (ref 150–450)
RBC: 5.39 x10E6/uL (ref 4.14–5.80)
RDW: 16.5 % — ABNORMAL HIGH (ref 11.6–15.4)
WBC: 5.6 10*3/uL (ref 3.4–10.8)

## 2019-12-31 ENCOUNTER — Other Ambulatory Visit: Payer: Self-pay | Admitting: Nurse Practitioner

## 2019-12-31 MED ORDER — AZITHROMYCIN 250 MG PO TABS: ORAL_TABLET | ORAL | 0 refills | Status: DC

## 2019-12-31 MED ORDER — PREDNISONE 10 MG PO TABS: ORAL_TABLET | ORAL | 0 refills | Status: DC

## 2020-01-04 ENCOUNTER — Other Ambulatory Visit: Payer: Self-pay

## 2020-01-04 ENCOUNTER — Encounter: Payer: Self-pay | Admitting: Nurse Practitioner

## 2020-01-04 ENCOUNTER — Ambulatory Visit (INDEPENDENT_AMBULATORY_CARE_PROVIDER_SITE_OTHER): Payer: Self-pay | Admitting: Nurse Practitioner

## 2020-01-04 VITALS — BP 159/80 | HR 98 | Temp 100.2°F | Ht 64.0 in | Wt 272.0 lb

## 2020-01-04 DIAGNOSIS — E119 Type 2 diabetes mellitus without complications: Secondary | ICD-10-CM

## 2020-01-04 DIAGNOSIS — L309 Dermatitis, unspecified: Secondary | ICD-10-CM

## 2020-01-04 DIAGNOSIS — I152 Hypertension secondary to endocrine disorders: Secondary | ICD-10-CM

## 2020-01-04 DIAGNOSIS — Z7689 Persons encountering health services in other specified circumstances: Secondary | ICD-10-CM

## 2020-01-04 LAB — POCT URINALYSIS DIPSTICK (MANUAL)
Leukocytes, UA: NEGATIVE
Nitrite, UA: NEGATIVE
Poct Bilirubin: NEGATIVE
Poct Blood: NEGATIVE
Poct Glucose: NORMAL mg/dL
Poct Ketones: NEGATIVE
Poct Protein: NEGATIVE mg/dL
Poct Urobilinogen: NORMAL mg/dL
Spec Grav, UA: 1.025 (ref 1.010–1.025)
pH, UA: 6 (ref 5.0–8.0)

## 2020-01-04 LAB — POCT GLYCOSYLATED HEMOGLOBIN (HGB A1C): Hemoglobin A1C: 6 % — AB (ref 4.0–5.6)

## 2020-01-04 MED ORDER — LISINOPRIL 10 MG PO TABS: 10.0000 mg | ORAL_TABLET | Freq: Every day | ORAL | 3 refills | Status: DC

## 2020-01-04 MED ORDER — TRIAMCINOLONE ACETONIDE 0.1 % EX CREA: 1.0000 "application " | TOPICAL_CREAM | Freq: Two times a day (BID) | CUTANEOUS | 0 refills | Status: DC

## 2020-01-04 NOTE — Patient Instructions (Addendum)
Blood Pressure Cuff     Diabetes Mellitus and Nutrition, Adult When you have diabetes (diabetes mellitus), it is very important to have healthy eating habits because your blood sugar (glucose) levels are greatly affected by what you eat and drink. Eating healthy foods in the appropriate amounts, at about the same times every day, can help you:  Control your blood glucose.  Lower your risk of heart disease.  Improve your blood pressure.  Reach or maintain a healthy weight. Every person with diabetes is different, and each person has different needs for a meal plan. Your health care provider may recommend that you work with a diet and nutrition specialist (dietitian) to make a meal plan that is best for you. Your meal plan may vary depending on factors such as:  The calories you need.  The medicines you take.  Your weight.  Your blood glucose, blood pressure, and cholesterol levels.  Your activity level.  Other health conditions you have, such as heart or kidney disease. How do carbohydrates affect me? Carbohydrates, also called carbs, affect your blood glucose level more than any other type of food. Eating carbs naturally raises the amount of glucose in your blood. Carb counting is a method for keeping track of how many carbs you eat. Counting carbs is important to keep your blood glucose at a healthy level, especially if you use insulin or take certain oral diabetes medicines. It is important to know how many carbs you can safely have in each meal. This is different for every person. Your dietitian can help you calculate how many carbs you should have at each meal and for each snack. Foods that contain carbs include:  Bread, cereal, rice, pasta, and crackers.  Potatoes and corn.  Peas, beans, and lentils.  Milk and yogurt.  Fruit and juice.  Desserts, such as cakes, cookies, ice cream, and candy. How does alcohol affect me? Alcohol can cause a sudden decrease in blood  glucose (hypoglycemia), especially if you use insulin or take certain oral diabetes medicines. Hypoglycemia can be a life-threatening condition. Symptoms of hypoglycemia (sleepiness, dizziness, and confusion) are similar to symptoms of having too much alcohol. If your health care provider says that alcohol is safe for you, follow these guidelines:  Limit alcohol intake to no more than 1 drink per day for nonpregnant women and 2 drinks per day for men. One drink equals 12 oz of beer, 5 oz of wine, or 1 oz of hard liquor.  Do not drink on an empty stomach.  Keep yourself hydrated with water, diet soda, or unsweetened iced tea.  Keep in mind that regular soda, juice, and other mixers may contain a lot of sugar and must be counted as carbs. What are tips for following this plan?  Reading food labels  Start by checking the serving size on the "Nutrition Facts" label of packaged foods and drinks. The amount of calories, carbs, fats, and other nutrients listed on the label is based on one serving of the item. Many items contain more than one serving per package.  Check the total grams (g) of carbs in one serving. You can calculate the number of servings of carbs in one serving by dividing the total carbs by 15. For example, if a food has 30 g of total carbs, it would be equal to 2 servings of carbs.  Check the number of grams (g) of saturated and trans fats in one serving. Choose foods that have low or no amount of  these fats.  Check the number of milligrams (mg) of salt (sodium) in one serving. Most people should limit total sodium intake to less than 2,300 mg per day.  Always check the nutrition information of foods labeled as "low-fat" or "nonfat". These foods may be higher in added sugar or refined carbs and should be avoided.  Talk to your dietitian to identify your daily goals for nutrients listed on the label. Shopping  Avoid buying canned, premade, or processed foods. These foods tend to  be high in fat, sodium, and added sugar.  Shop around the outside edge of the grocery store. This includes fresh fruits and vegetables, bulk grains, fresh meats, and fresh dairy. Cooking  Use low-heat cooking methods, such as baking, instead of high-heat cooking methods like deep frying.  Cook using healthy oils, such as olive, canola, or sunflower oil.  Avoid cooking with butter, cream, or high-fat meats. Meal planning  Eat meals and snacks regularly, preferably at the same times every day. Avoid going long periods of time without eating.  Eat foods high in fiber, such as fresh fruits, vegetables, beans, and whole grains. Talk to your dietitian about how many servings of carbs you can eat at each meal.  Eat 4-6 ounces (oz) of lean protein each day, such as lean meat, chicken, fish, eggs, or tofu. One oz of lean protein is equal to: ? 1 oz of meat, chicken, or fish. ? 1 egg. ?  cup of tofu.  Eat some foods each day that contain healthy fats, such as avocado, nuts, seeds, and fish. Lifestyle  Check your blood glucose regularly.  Exercise regularly as told by your health care provider. This may include: ? 150 minutes of moderate-intensity or vigorous-intensity exercise each week. This could be brisk walking, biking, or water aerobics. ? Stretching and doing strength exercises, such as yoga or weightlifting, at least 2 times a week.  Take medicines as told by your health care provider.  Do not use any products that contain nicotine or tobacco, such as cigarettes and e-cigarettes. If you need help quitting, ask your health care provider.  Work with a Social worker or diabetes educator to identify strategies to manage stress and any emotional and social challenges. Questions to ask a health care provider  Do I need to meet with a diabetes educator?  Do I need to meet with a dietitian?  What number can I call if I have questions?  When are the best times to check my blood  glucose? Where to find more information:  American Diabetes Association: diabetes.org  Academy of Nutrition and Dietetics: www.eatright.[employer] of Diabetes and Digestive and Kidney Diseases (NIH): [employer] Summary  A healthy meal plan will help you control your blood glucose and maintain a healthy lifestyle.  Working with a diet and nutrition specialist (dietitian) can help you make a meal plan that is best for you.  Keep in mind that carbohydrates (carbs) and alcohol have immediate effects on your blood glucose levels. It is important to count carbs and to use alcohol carefully. This information is not intended to replace advice given to you by your health care provider. Make sure you discuss any questions you have with your health care provider. Document Revised: 03/04/2017 Document Reviewed: 04/26/2016 Elsevier Patient Education  2020 [employer].   Steps to Quit Smoking Smoking tobacco is the leading cause of preventable death. It can affect almost every organ in the body. Smoking puts you and people around you at  risk for many serious, long-lasting (chronic) diseases. Quitting smoking can be hard, but it is one of the best things that you can do for your health. It is never too late to quit. How do I get ready to quit? When you decide to quit smoking, make a plan to help you succeed. Before you quit:  Pick a date to quit. Set a date within the next 2 weeks to give you time to prepare.  Write down the reasons why you are quitting. Keep this list in places where you will see it often.  Tell your family, friends, and co-workers that you are quitting. Their support is important.  Talk with your doctor about the choices that may help you quit.  Find out if your health insurance will pay for these treatments.  Know the people, places, things, and activities that make you want to smoke (triggers). Avoid them. What first steps can I take to quit  smoking?  Throw away all cigarettes at home, at work, and in your car.  Throw away the things that you use when you smoke, such as ashtrays and lighters.  Clean your car. Make sure to empty the ashtray.  Clean your home, including curtains and carpets. What can I do to help me quit smoking? Talk with your doctor about taking medicines and seeing a counselor at the same time. You are more likely to succeed when you do both.  If you are pregnant or breastfeeding, talk with your doctor about counseling or other ways to quit smoking. Do not take medicine to help you quit smoking unless your doctor tells you to do so. To quit smoking: Quit right away  Quit smoking totally, instead of slowly cutting back on how much you smoke over a period of time.  Go to counseling. You are more likely to quit if you go to counseling sessions regularly. Take medicine You may take medicines to help you quit. Some medicines need a prescription, and some you can buy over-the-counter. Some medicines may contain a drug called nicotine to replace the nicotine in cigarettes. Medicines may:  Help you to stop having the desire to smoke (cravings).  Help to stop the problems that come when you stop smoking (withdrawal symptoms). Your doctor may ask you to use:  Nicotine patches, gum, or lozenges.  Nicotine inhalers or sprays.  Non-nicotine medicine that is taken by mouth. Find resources Find resources and other ways to help you quit smoking and remain smoke-free after you quit. These resources are most helpful when you use them often. They include:  Online chats with a Social worker.  Phone quitlines.  Printed Furniture conservator/restorer.  Support groups or group counseling.  Text messaging programs.  Mobile phone apps. Use apps on your mobile phone or tablet that can help you stick to your quit plan. There are many free apps for mobile phones and tablets as well as websites. Examples include Quit Guide from the  [employer] and smokefree.gov  What things can I do to make it easier to quit?   Talk to your family and friends. Ask them to support and encourage you.  Call a phone quitline (1-800-QUIT-NOW), reach out to support groups, or work with a Social worker.  Ask people who smoke to not smoke around you.  Avoid places that make you want to smoke, such as: ? Bars. ? Parties. ? Smoke-break areas at work.  Spend time with people who do not smoke.  Lower the stress in your life. Stress can  make you want to smoke. Try these things to help your stress: ? Getting regular exercise. ? Doing deep-breathing exercises. ? Doing yoga. ? Meditating. ? Doing a body scan. To do this, close your eyes, focus on one area of your body at a time from head to toe. Notice which parts of your body are tense. Try to relax the muscles in those areas. How will I feel when I quit smoking? Day 1 to 3 weeks Within the first 24 hours, you may start to have some problems that come from quitting tobacco. These problems are very bad 2-3 days after you quit, but they do not often last for more than 2-3 weeks. You may get these symptoms:  Mood swings.  Feeling restless, nervous, angry, or annoyed.  Trouble concentrating.  Dizziness.  Strong desire for high-sugar foods and nicotine.  Weight gain.  Trouble pooping (constipation).  Feeling like you may vomit (nausea).  Coughing or a sore throat.  Changes in how the medicines that you take for other issues work in your body.  Depression.  Trouble sleeping (insomnia). Week 3 and afterward After the first 2-3 weeks of quitting, you may start to notice more positive results, such as:  Better sense of smell and taste.  Less coughing and sore throat.  Slower heart rate.  Lower blood pressure.  Clearer skin.  Better breathing.  Fewer sick days. Quitting smoking can be hard. Do not give up if you fail the first time. Some people need to try a few times before they  succeed. Do your best to stick to your quit plan, and talk with your doctor if you have any questions or concerns. Summary  Smoking tobacco is the leading cause of preventable death. Quitting smoking can be hard, but it is one of the best things that you can do for your health.  When you decide to quit smoking, make a plan to help you succeed.  Quit smoking right away, not slowly over a period of time.  When you start quitting, seek help from your doctor, family, or friends. This information is not intended to replace advice given to you by your health care provider. Make sure you discuss any questions you have with your health care provider. Document Revised: 12/15/2018 Document Reviewed: 06/10/2018 Elsevier Patient Education  [employer].   Type 2 Diabetes Mellitus, Self Care, Adult When you have type 2 diabetes (type 2 diabetes mellitus), you must make sure your blood sugar (glucose) stays in a healthy range. You can do this with:  Nutrition.  Exercise.  Lifestyle changes.  Medicines or insulin, if needed.  Support from your doctors and others. How to stay aware of blood sugar   Check your blood sugar level every day, as often as told.  Have your A1c (hemoglobin A1c) level checked two or more times a year. Have it checked more often if your doctor tells you to. Your doctor will set personal treatment goals for you. Generally, you should have these blood sugar levels:  Before meals (preprandial): 80-130 mg/dL (4.4-7.2 mmol/L).  After meals (postprandial): below 180 mg/dL (10 mmol/L).  A1c level: less than 7%. How to manage high and low blood sugar Signs of high blood sugar High blood sugar is called hyperglycemia. Know the signs of high blood sugar. Signs may include:  Feeling: ? Thirsty. ? Hungry. ? Very tired.  Needing to pee (urinate) more than usual.  Blurry vision. Signs of low blood sugar Low blood sugar is called  hypoglycemia. This is when blood  sugar is at or below 70 mg/dL (3.9 mmol/L). Signs may include:  Feeling: ? Hungry. ? Worried or nervous (anxious). ? Sweaty and clammy. ? Confused. ? Dizzy. ? Sleepy. ? Sick to your stomach (nauseous).  Having: ? A fast heartbeat. ? A headache. ? A change in your vision. ? Jerky movements that you cannot control (seizure). ? Tingling or no feeling (numbness) around your mouth, lips, or tongue.  Having trouble with: ? Moving (coordination). ? Sleeping. ? Passing out (fainting). ? Getting upset easily (irritability). Treating low blood sugar To treat low blood sugar, eat or drink something sugary right away. If you can think clearly and swallow safely, follow the 15:15 rule:  Take 15 grams of a fast-acting carb (carbohydrate). Talk with your doctor about how much you should take.  Some fast-acting carbs are: ? Sugar tablets (glucose pills). Take 3-4 pills. ? 6-8 pieces of hard candy. ? 4-6 oz (120-150 mL) of fruit juice. ? 4-6 oz (120-150 mL) of regular (not diet) soda. ? 1 Tbsp (15 mL) honey or sugar.  Check your blood sugar 15 minutes after you take the carb.  If your blood sugar is still at or below 70 mg/dL (3.9 mmol/L), take 15 grams of a carb again.  If your blood sugar does not go above 70 mg/dL (3.9 mmol/L) after 3 tries, get help right away.  After your blood sugar goes back to normal, eat a meal or a snack within 1 hour. Treating very low blood sugar If your blood sugar is at or below 54 mg/dL (3 mmol/L), you have very low blood sugar (severe hypoglycemia). This is an emergency. Do not wait to see if the symptoms will go away. Get medical help right away. Call your local emergency services (911 in the [place] If you have very low blood sugar and you cannot eat or drink, you may need a glucagon shot (injection). A family member or friend should learn how to check your blood sugar and how to give you a glucagon shot. Ask your doctor if you need to have a glucagon shot  kit at home. Follow these instructions at home: Medicine  Take insulin and diabetes medicines as told.  If your doctor says you should take more or less insulin and medicines, do this exactly as told.  Do not run out of insulin or medicines. Having diabetes can raise your risk for other long-term conditions. These include heart disease and kidney disease. Your doctor may prescribe medicines to help you not have these problems. Food   Make healthy food choices. These include: ? Chicken, fish, egg whites, and beans. ? Oats, whole wheat, bulgur, brown rice, quinoa, and millet. ? Fresh fruits and vegetables. ? Low-fat dairy products. ? Nuts, avocado, olive oil, and canola oil.  Meet with a food specialist (dietitian). He or she can help you make an eating plan that is right for you.  Follow instructions from your doctor about what you cannot eat or drink.  Drink enough fluid to keep your pee (urine) pale yellow.  Keep track of carbs that you eat. Do this by reading food labels and learning food serving sizes.  Follow your sick day plan when you cannot eat or drink normally. Make this plan with your doctor so it is ready to use. Activity  Exercise 3 or more times a week.  Do not go more than 2 days without exercising.  Talk with your doctor before you start  a new exercise. Your doctor may need to tell you to change: ? How much insulin or medicines you take. ? How much food you eat. Lifestyle  Do not use any tobacco products. These include cigarettes, chewing tobacco, and e-cigarettes. If you need help quitting, ask your doctor.  Ask your doctor how much alcohol is safe for you.  Learn to deal with stress. If you need help with this, ask your doctor. Body care   Stay up to date with your shots (immunizations).  Have your eyes and feet checked by a doctor as often as told.  Check your skin and feet every day. Check for cuts, bruises, redness, blisters, or sores.  Brush  your teeth and gums two times a day. Floss one or more times a day.  Go to the dentist one or more times every 6 months.  Stay at a healthy weight. General instructions  Take over-the-counter and prescription medicines only as told by your doctor.  Share your diabetes care plan with: ? Your work or school. ? People you live with.  Carry a card or wear jewelry that says you have diabetes.  Keep all follow-up visits as told by your doctor. This is important. Questions to ask your doctor  Do I need to meet with a diabetes educator?  Where can I find a support group for people with diabetes? Where to find more information To learn more about diabetes, visit:  American Diabetes Association: www.diabetes.org  American Association of Diabetes Educators: www.diabeteseducator.org Summary  When you have type 2 diabetes, you must make sure your blood sugar (glucose) stays in a healthy range.  Check your blood sugar every day, as often as told.  Having diabetes can raise your risk for other conditions. Your doctor may prescribe medicines to help you not have these problems.  Keep all follow-up visits as told by your doctor. This is important. This information is not intended to replace advice given to you by your health care provider. Make sure you discuss any questions you have with your health care provider. Document Revised: 09/12/2017 Document Reviewed: 04/25/2015 Elsevier Patient Education  2020 [employer].   Managing Your Hypertension Hypertension is commonly called high blood pressure. This is when the force of your blood pressing against the walls of your arteries is too strong. Arteries are blood vessels that carry blood from your heart throughout your body. Hypertension forces the heart to work harder to pump blood, and may cause the arteries to become narrow or stiff. Having untreated or uncontrolled hypertension can cause heart attack, stroke, kidney disease, and other  problems. What are blood pressure readings? A blood pressure reading consists of a higher number over a lower number. Ideally, your blood pressure should be below 120/80. The first ("top") number is called the systolic pressure. It is a measure of the pressure in your arteries as your heart beats. The second ("bottom") number is called the diastolic pressure. It is a measure of the pressure in your arteries as the heart relaxes. What does my blood pressure reading mean? Blood pressure is classified into four stages. Based on your blood pressure reading, your health care provider may use the following stages to determine what type of treatment you need, if any. Systolic pressure and diastolic pressure are measured in a unit called mm Hg. Normal  Systolic pressure: below 947.  Diastolic pressure: below 80. Elevated  Systolic pressure: 096-283.  Diastolic pressure: below 80. Hypertension stage 1  Systolic pressure: 662-947.  Diastolic pressure: 46-28. Hypertension stage 2  Systolic pressure: 638 or above.  Diastolic pressure: 90 or above. What health risks are associated with hypertension? Managing your hypertension is an important responsibility. Uncontrolled hypertension can lead to:  A heart attack.  A stroke.  A weakened blood vessel (aneurysm).  Heart failure.  Kidney damage.  Eye damage.  Metabolic syndrome.  Memory and concentration problems. What changes can I make to manage my hypertension? Hypertension can be managed by making lifestyle changes and possibly by taking medicines. Your health care provider will help you make a plan to bring your blood pressure within a normal range. Eating and drinking   Eat a diet that is high in fiber and potassium, and low in salt (sodium), added sugar, and fat. An example eating plan is called the DASH (Dietary Approaches to Stop Hypertension) diet. To eat this way: ? Eat plenty of fresh fruits and vegetables. Try to fill half  of your plate at each meal with fruits and vegetables. ? Eat whole grains, such as whole wheat pasta, brown rice, or whole grain bread. Fill about one quarter of your plate with whole grains. ? Eat low-fat diary products. ? Avoid fatty cuts of meat, processed or cured meats, and poultry with skin. Fill about one quarter of your plate with lean proteins such as fish, chicken without skin, beans, eggs, and tofu. ? Avoid premade and processed foods. These tend to be higher in sodium, added sugar, and fat.  Reduce your daily sodium intake. Most people with hypertension should eat less than 1,500 mg of sodium a day.  Limit alcohol intake to no more than 1 drink a day for nonpregnant women and 2 drinks a day for men. One drink equals 12 oz of beer, 5 oz of wine, or 1 oz of hard liquor. Lifestyle  Work with your health care provider to maintain a healthy body weight, or to lose weight. Ask what an ideal weight is for you.  Get at least 30 minutes of exercise that causes your heart to beat faster (aerobic exercise) most days of the week. Activities may include walking, swimming, or biking.  Include exercise to strengthen your muscles (resistance exercise), such as weight lifting, as part of your weekly exercise routine. Try to do these types of exercises for 30 minutes at least 3 days a week.  Do not use any products that contain nicotine or tobacco, such as cigarettes and e-cigarettes. If you need help quitting, ask your health care provider.  Control any long-term (chronic) conditions you have, such as high cholesterol or diabetes. Monitoring  Monitor your blood pressure at home as told by your health care provider. Your personal target blood pressure may vary depending on your medical conditions, your age, and other factors.  Have your blood pressure checked regularly, as often as told by your health care provider. Working with your health care provider  Review all the medicines you take with  your health care provider because there may be side effects or interactions.  Talk with your health care provider about your diet, exercise habits, and other lifestyle factors that may be contributing to hypertension.  Visit your health care provider regularly. Your health care provider can help you create and adjust your plan for managing hypertension. Will I need medicine to control my blood pressure? Your health care provider may prescribe medicine if lifestyle changes are not enough to get your blood pressure under control, and if:  Your systolic blood pressure is  130 or higher.  Your diastolic blood pressure is 80 or higher. Take medicines only as told by your health care provider. Follow the directions carefully. Blood pressure medicines must be taken as prescribed. The medicine does not work as well when you skip doses. Skipping doses also puts you at risk for problems. Contact a health care provider if:  You think you are having a reaction to medicines you have taken.  You have repeated (recurrent) headaches.  You feel dizzy.  You have swelling in your ankles.  You have trouble with your vision. Get help right away if:  You develop a severe headache or confusion.  You have unusual weakness or numbness, or you feel faint.  You have severe pain in your chest or abdomen.  You vomit repeatedly.  You have trouble breathing. Summary  Hypertension is when the force of blood pumping through your arteries is too strong. If this condition is not controlled, it may put you at risk for serious complications.  Your personal target blood pressure may vary depending on your medical conditions, your age, and other factors. For most people, a normal blood pressure is less than 120/80.  Hypertension is managed by lifestyle changes, medicines, or both. Lifestyle changes include weight loss, eating a healthy, low-sodium diet, exercising more, and limiting alcohol. This information is not  intended to replace advice given to you by your health care provider. Make sure you discuss any questions you have with your health care provider. Document Revised: 07/14/2018 Document Reviewed: 02/18/2016 Elsevier Patient Education  [place].

## 2020-01-04 NOTE — Progress Notes (Signed)
[place], [place]  [address] Phone:  [phone]   Fax:  [phone]   New Patient Office Visit  Subjective:  Patient ID: Mitchell Steele, male    DOB: 04/23/1995  Age: 24 y.o. MRN: [id number]  CC:  Chief Complaint  Patient presents with  . Follow-up    HPI Mitchell Steele presents to establish care. He  has a past medical history of Eczema, Migraine, and Obesity.   Diabetes Mellitus Patient presents for follow up of diabetes. Current symptoms include: polydipsia. Symptoms have stabilized. Patient denies foot ulcerations, hyperglycemia, hypoglycemia , increased appetite, nausea, paresthesia of the feet, polyuria, visual disturbances and vomiting. Evaluation to date has included: fasting blood sugar, fasting lipid panel and hemoglobin A1C.  Home sugars: BGs consistently in an acceptable range. Current treatment: Continued metformin which has been effective. Last dilated eye exam: Unknown  Hypertension Patient is here for follow-up of elevated blood pressure. He is exercising and is adherent to a low-salt diet. Cardiac symptoms: none. Patient denies chest pain, dyspnea, exertional chest pressure/discomfort, irregular heart beat, lower extremity edema, palpitations and syncope. Cardiovascular risk factors: diabetes mellitus, hypertension, male gender and obesity (BMI >= 30 kg/m2). Use of agents associated with hypertension: none. History of target organ damage: none.  Past Medical History:  Diagnosis Date  . Eczema   . Migraine   . Obesity     History reviewed. No pertinent surgical history.  Family History  Problem Relation Age of Onset  . Diabetes Mother   . Diabetes Maternal Grandmother   . Hypertension Maternal Grandmother   . Heart attack Maternal Grandmother   . Heart disease Maternal Grandmother   . Alcohol abuse Maternal Uncle     Social History   Socioeconomic History  . Marital status: Single    Spouse name: Not on  file  . Number of children: Not on file  . Years of education: Not on file  . Highest education level: Not on file  Occupational History  . Occupation: Student    Comment: 10/11th grade at [employer] Use  . Smoking status: Current Every Day Smoker    Packs/day: 0.00    Types: Cigars, Cigarettes  . Smokeless tobacco: Never Used  Substance and Sexual Activity  . Alcohol use: No    Alcohol/week: 0.0 standard drinks  . Drug use: No  . Sexual activity: Not on file  Other Topics Concern  . Not on file  Social History Narrative   Mother is [name]   Social Determinants of Health   Financial Resource Strain:   . Difficulty of Paying Living Expenses: Not on file  Food Insecurity:   . Worried About Charity fundraiser in the Last Year: Not on file  . Ran Out of Food in the Last Year: Not on file  Transportation Needs:   . Lack of Transportation (Medical): Not on file  . Lack of Transportation (Non-Medical): Not on file  Physical Activity:   . Days of Exercise per Week: Not on file  . Minutes of Exercise per Session: Not on file  Stress:   . Feeling of Stress : Not on file  Social Connections:   . Frequency of Communication with Friends and Family: Not on file  . Frequency of Social Gatherings with Friends and Family: Not on file  . Attends Religious Services: Not on file  . Active Member of Clubs or Organizations: Not on file  . Attends Club  or Organization Meetings: Not on file  . Marital Status: Not on file  Intimate Partner Violence:   . Fear of Current or Ex-Partner: Not on file  . Emotionally Abused: Not on file  . Physically Abused: Not on file  . Sexually Abused: Not on file    ROS Review of Systems  Constitutional: Negative for chills and fever.  HENT: Negative.   Eyes: Negative.  Negative for visual disturbance.  Respiratory: Negative for shortness of breath.   Cardiovascular: Negative for chest pain.  Gastrointestinal: Positive for diarrhea.    Endocrine: Positive for polydipsia. Negative for polyphagia and polyuria.  Genitourinary: Negative.   Musculoskeletal: Negative.   Skin: Negative.   Allergic/Immunologic: Negative.   Hematological: Negative.   Psychiatric/Behavioral: Negative.     Objective:   Today's Vitals: BP (!) 159/80   Pulse 98   Temp 100.2 F (37.9 C) (Temporal)   Ht 5\' 4"  (1.626 m)   Wt 272 lb (123.4 kg)   SpO2 97%   BMI 46.69 kg/m   Physical Exam Constitutional:      Appearance: He is obese.  HENT:     Head: Normocephalic and atraumatic.     Nose: Nose normal.     Mouth/Throat:     Mouth: Mucous membranes are moist.  Cardiovascular:     Rate and Rhythm: Normal rate.     Pulses: Normal pulses.     Heart sounds: Normal heart sounds.  Pulmonary:     Effort: Pulmonary effort is normal.     Breath sounds: Normal breath sounds.  Abdominal:     Palpations: Abdomen is soft.     Comments: Hypoactive   Musculoskeletal:        General: Normal range of motion.     Cervical back: Normal range of motion.  Skin:    General: Skin is warm and dry.     Capillary Refill: Capillary refill takes less than 2 seconds.  Neurological:     General: No focal deficit present.     Mental Status: He is alert and oriented to person, place, and time.  Psychiatric:        Mood and Affect: Mood normal.        Behavior: Behavior normal.        Thought Content: Thought content normal.        Judgment: Judgment normal.     Assessment & Plan:   Problem List Items Addressed This Visit      Endocrine   Type 2 diabetes mellitus without complication, without long-term current use of insulin (HCC) - Primary Encourage compliance with current treatment regimen  Encourage regular CBG monitoring Encourage contacting office if excessive hyperglycemia and or hypoglycemia Lifestyle modification with healthy diet (fewer calories, more high fiber foods, whole grains and non-starchy vegetables, lower fat meat and fish,  low-fat diary include healthy oils) regular exercise (physical activity) and weight loss Opthalmology exam discussed  Nutritional consult recommended Regular dental visits encouraged Home BP monitoring also encouraged goal <130/80     Relevant Medications   lisinopril (ZESTRIL) 10 MG tablet   Other Relevant Orders   POCT HgB A1C (Completed)   POCT Urinalysis Dip Manual (Completed)    Other Visit Diagnoses    Encounter to establish care       Hypertension due to endocrine disorder  Encouraged on going compliance with current medication regimen Encouraged home monitoring and recording BP <130/80 Eating a heart-healthy diet with less salt Encouraged regular physical activity  Recommend  Weight loss         Relevant Medications   lisinopril (ZESTRIL) 10 MG tablet   Eczema, unspecified type       Relevant Medications   triamcinolone cream (KENALOG) 0.1 %      Outpatient Encounter Medications as of 01/04/2020  Medication Sig  . metFORMIN (GLUCOPHAGE) 500 MG tablet Take 1 tablet (500 mg total) by mouth 2 (two) times daily with a meal.  . ibuprofen (ADVIL,MOTRIN) 800 MG tablet Take 1 tablet (800 mg total) by mouth 3 (three) times daily. (Patient taking differently: Take 800 mg by mouth every 8 (eight) hours as needed for mild pain. )  . lisinopril (ZESTRIL) 10 MG tablet Take 1 tablet (10 mg total) by mouth daily.  . predniSONE (DELTASONE) 10 MG tablet Take 4 tabs for 2 days, then 3 tabs for 2 days, then 2 tabs for 2 days, then 1 tab for 2 days, then stop (Patient not taking: Reported on 01/04/2020)  . triamcinolone cream (KENALOG) 0.1 % Apply 1 application topically 2 (two) times daily.  . [DISCONTINUED] azithromycin (ZITHROMAX Z-PAK) 250 MG tablet Take 2 tablets (500 mg) on  Day 1,  followed by 1 tablet (250 mg) once daily on Days 2 through 5. (Patient not taking: Reported on 01/04/2020)   No facility-administered encounter medications on file as of 01/04/2020.    Follow-up: Return in  about 6 weeks (around 02/15/2020).   Vevelyn Francois, NP

## 2020-01-29 ENCOUNTER — Telehealth (INDEPENDENT_AMBULATORY_CARE_PROVIDER_SITE_OTHER): Payer: HRSA Program | Admitting: Nurse Practitioner

## 2020-01-29 DIAGNOSIS — U071 COVID-19: Secondary | ICD-10-CM | POA: Diagnosis not present

## 2020-01-29 DIAGNOSIS — J1282 Pneumonia due to coronavirus disease 2019: Secondary | ICD-10-CM

## 2020-01-29 NOTE — Patient Instructions (Addendum)
Pneumonia due to COVID-19 virus  Stay well hydrated  Stay active  Deep breathing exercises  May start vitamin C 2,000 mg daily, vitamin D3 2,000 IU daily, Zinc 220 mg daily, and Quercetin 500 mg twice daily  May take tylenol or fever or pain  May take mucinex DM twice daily  Will order chest x ray     Follow up:  Follow up as needed

## 2020-01-29 NOTE — Progress Notes (Signed)
Virtual Visit via Telephone Note  I connected with Mitchell Steele on 01/29/20 at 10:30 AM EDT by telephone and verified that I am speaking with the correct person using two identifiers.  Location: Patient: Home Provider: Office   I discussed the limitations, risks, security and privacy concerns of performing an evaluation and management service by telephone and the availability of in person appointments. I also discussed with the patient that there may be a patient responsible charge related to this service. The patient expressed understanding and agreed to proceed.   History of Present Illness:  24 year old male with history of migraine, allergic rhinitis, eczema, obesity.  Diagnosed with Covid on 12/06/2019.  Newly diagnosed with diabetes during recent hospitalization.   Patient presents for a televisit for post COVID care clinic visit follow-up.  Patient states that he has been doing well since his last visit here on 12/27/2019.  His chest x-ray at that visit showed worsening pneumonia.  Patient did take a round of azithromycin and prednisone.  He states that overall he is doing well has no new concerns or issues.  We will order a repeat x-ray. Denies f/c/s, n/v/d, hemoptysis, PND, chest pain or edema.        Observations/Objective:  Vitals with BMI 01/04/2020 12/27/2019 12/16/2019  Height 5\' 4"  - -  Weight 272 lbs 270 lbs -  BMI 12.45 80.99 -  Systolic 833 825 -  Diastolic 80 88 -  Pulse 98 112 100  Some encounter information is confidential and restricted. Go to Review Flowsheets activity to see all data.      Assessment and Plan:  Pneumonia due to COVID-19 virus  Stay well hydrated  Stay active  Deep breathing exercises  May start vitamin C 2,000 mg daily, vitamin D3 2,000 IU daily, Zinc 220 mg daily, and Quercetin 500 mg twice daily  May take tylenol or fever or pain  May take mucinex DM twice daily  Will order chest x ray     Follow up:  Follow  up as needed    Follow Up Instructions:    I discussed the assessment and treatment plan with the patient. The patient was provided an opportunity to ask questions and all were answered. The patient agreed with the plan and demonstrated an understanding of the instructions.   The patient was advised to call back or seek an in-person evaluation if the symptoms worsen or if the condition fails to improve as anticipated.  I provided 22 minutes of non-face-to-face time during this encounter.   Fenton Foy, NP

## 2020-02-15 ENCOUNTER — Ambulatory Visit: Payer: Self-pay | Admitting: Nurse Practitioner

## 2020-02-18 ENCOUNTER — Other Ambulatory Visit: Payer: Self-pay

## 2020-02-18 ENCOUNTER — Encounter: Payer: Self-pay | Admitting: Nurse Practitioner

## 2020-02-18 ENCOUNTER — Ambulatory Visit (INDEPENDENT_AMBULATORY_CARE_PROVIDER_SITE_OTHER): Payer: Self-pay | Admitting: Nurse Practitioner

## 2020-02-18 VITALS — BP 146/81 | HR 68 | Temp 99.2°F | Resp 20 | Ht 65.0 in | Wt 278.4 lb

## 2020-02-18 DIAGNOSIS — E119 Type 2 diabetes mellitus without complications: Secondary | ICD-10-CM

## 2020-02-18 DIAGNOSIS — T1491XA Suicide attempt, initial encounter: Secondary | ICD-10-CM

## 2020-02-18 DIAGNOSIS — F17209 Nicotine dependence, unspecified, with unspecified nicotine-induced disorders: Secondary | ICD-10-CM

## 2020-02-18 LAB — POCT URINALYSIS DIP (CLINITEK)
Bilirubin, UA: NEGATIVE
Blood, UA: NEGATIVE
Glucose, UA: NEGATIVE mg/dL
Ketones, POC UA: NEGATIVE mg/dL
Leukocytes, UA: NEGATIVE
Nitrite, UA: NEGATIVE
POC PROTEIN,UA: NEGATIVE
Spec Grav, UA: 1.025 (ref 1.010–1.025)
Urobilinogen, UA: 1 E.U./dL
pH, UA: 7 (ref 5.0–8.0)

## 2020-02-18 NOTE — Progress Notes (Signed)
[facility] [place], [place]  [address] Phone:  [phone]   Fax:  [phone]   Established Patient Office Visit  Subjective:  Patient ID: Mitchell Steele, male    DOB: Jun 07, 1995  Age: 24 y.o. MRN: [id number]  CC:  Chief Complaint  Patient presents with  . Follow-up    HPI Mitchell Steele presents for follow up. He  has a past medical history of Eczema, Migraine, and Obesity.   Diabetes Mellitus Patient presents for follow up of diabetes. Current symptoms include: none. Symptoms have stabilized. Patient denies foot ulcerations, hypoglycemia , nausea, polydipsia, polyuria, visual disturbances and vomiting. Evaluation to date has included: fasting blood sugar and hemoglobin A1C.  Home sugars: BGs consistently in an acceptable range. Current treatment: more intensive attention to diet which has been not very effective and Continued metformin which has been somewhat effective.  Past Medical History:  Diagnosis Date  . Eczema   . Migraine   . Obesity     History reviewed. No pertinent surgical history.  Family History  Problem Relation Age of Onset  . Diabetes Mother   . Diabetes Maternal Grandmother   . Hypertension Maternal Grandmother   . Heart attack Maternal Grandmother   . Heart disease Maternal Grandmother   . Alcohol abuse Maternal Uncle     Social History   Socioeconomic History  . Marital status: Single    Spouse name: Not on file  . Number of children: Not on file  . Years of education: Not on file  . Highest education level: Not on file  Occupational History  . Occupation: Student    Comment: 10/11th grade at [employer] Use  . Smoking status: Current Every Day Smoker    Packs/day: 0.00    Types: Cigars, Cigarettes  . Smokeless tobacco: Never Used  Substance and Sexual Activity  . Alcohol use: No    Alcohol/week: 0.0 standard drinks  . Drug use: No  . Sexual activity: Not on file  Other Topics Concern  .  Not on file  Social History Narrative   Mother is [name]   Social Determinants of Health   Financial Resource Strain:   . Difficulty of Paying Living Expenses: Not on file  Food Insecurity:   . Worried About Charity fundraiser in the Last Year: Not on file  . Ran Out of Food in the Last Year: Not on file  Transportation Needs:   . Lack of Transportation (Medical): Not on file  . Lack of Transportation (Non-Medical): Not on file  Physical Activity:   . Days of Exercise per Week: Not on file  . Minutes of Exercise per Session: Not on file  Stress:   . Feeling of Stress : Not on file  Social Connections:   . Frequency of Communication with Friends and Family: Not on file  . Frequency of Social Gatherings with Friends and Family: Not on file  . Attends Religious Services: Not on file  . Active Member of Clubs or Organizations: Not on file  . Attends Archivist Meetings: Not on file  . Marital Status: Not on file  Intimate Partner Violence:   . Fear of Current or Ex-Partner: Not on file  . Emotionally Abused: Not on file  . Physically Abused: Not on file  . Sexually Abused: Not on file    Outpatient Medications Prior to Visit  Medication Sig Dispense Refill  . ibuprofen (ADVIL,MOTRIN) 800 MG tablet  Take 1 tablet (800 mg total) by mouth 3 (three) times daily. (Patient taking differently: Take 800 mg by mouth every 8 (eight) hours as needed for mild pain. ) 21 tablet 0  . lisinopril (ZESTRIL) 10 MG tablet Take 1 tablet (10 mg total) by mouth daily. 90 tablet 3  . metFORMIN (GLUCOPHAGE) 500 MG tablet Take 1 tablet (500 mg total) by mouth 2 (two) times daily with a meal. 60 tablet 3  . triamcinolone cream (KENALOG) 0.1 % Apply 1 application topically 2 (two) times daily. 453.6 g 0   No facility-administered medications prior to visit.    No Known Allergies  ROS Review of Systems    Objective:    Physical Exam  BP (!) 146/81 (BP Location: Left Arm,  Patient Position: Sitting, Cuff Size: Large)   Pulse 68   Temp 99.2 F (37.3 C) (Temporal)   Resp 20   Ht 5\' 5"  (1.651 m)   Wt 278 lb 6.4 oz (126.3 kg)   SpO2 98%   BMI 46.33 kg/m  Wt Readings from Last 3 Encounters:  02/18/20 278 lb 6.4 oz (126.3 kg)  01/04/20 272 lb (123.4 kg)  12/27/19 270 lb (122.5 kg)     There are no preventive care reminders to display for this patient.  There are no preventive care reminders to display for this patient.  No results found for: TSH Lab Results  Component Value Date   WBC 5.6 12/27/2019   HGB 14.5 12/27/2019   HCT 42.3 12/27/2019   MCV 79 12/27/2019   PLT 286 12/27/2019   Lab Results  Component Value Date   NA 142 12/27/2019   K 4.4 12/27/2019   CO2 25 12/27/2019   GLUCOSE 83 12/27/2019   BUN 6 12/27/2019   CREATININE 0.83 12/27/2019   BILITOT 0.7 12/27/2019   ALKPHOS 66 12/27/2019   AST 27 12/27/2019   ALT 67 (H) 12/27/2019   PROT 7.2 12/27/2019   ALBUMIN 4.4 12/27/2019   CALCIUM 9.2 12/27/2019   ANIONGAP 8 12/16/2019   Lab Results  Component Value Date   CHOL 117 12/11/2019   Lab Results  Component Value Date   HDL 22 (L) 12/11/2019   Lab Results  Component Value Date   LDLCALC 67 12/11/2019   Lab Results  Component Value Date   TRIG 138 12/11/2019   Lab Results  Component Value Date   CHOLHDL 5.3 12/11/2019   Lab Results  Component Value Date   HGBA1C 6.0 (A) 01/04/2020      Assessment & Plan:   Problem List Items Addressed This Visit      Endocrine   Type 2 diabetes mellitus without complication, without long-term current use of insulin (HCC) - Primary Encourage compliance with current treatment regimen   Encourage regular CBG monitoring Encourage contacting office if excessive hyperglycemia and or hypoglycemia Lifestyle modification with healthy diet (fewer calories, more high fiber foods, whole grains and non-starchy vegetables, lower fat meat and fish, low-fat diary include healthy oils)  regular exercise (physical activity) and weight loss Opthalmology exam discussed  Nutritional consult recommended Regular dental visits encouraged Home BP monitoring also encouraged goal <130/80     Relevant Orders   POCT URINALYSIS DIP (CLINITEK) (Completed)     Other   Suicide attempt ([place]) We will continue to monitor with regular screening    Other Visit Diagnoses    Tobacco use disorder, continuous          No orders of the defined types  were placed in this encounter.   Follow-up: Return in about 8 weeks (around 04/14/2020).    Vevelyn Francois, NP

## 2020-02-18 NOTE — Patient Instructions (Addendum)
[facility] Address: [address], [place], [place] [address] Phone: [phone]   [facility] [address] [place], [place]  [address] Phone:  [phone]      Diabetes Mellitus and Nutrition, Adult When you have diabetes (diabetes mellitus), it is very important to have healthy eating habits because your blood sugar (glucose) levels are greatly affected by what you eat and drink. Eating healthy foods in the appropriate amounts, at about the same times every day, can help you:  Control your blood glucose.  Lower your risk of heart disease.  Improve your blood pressure.  Reach or maintain a healthy weight. Every person with diabetes is different, and each person has different needs for a meal plan. Your health care provider may recommend that you work with a diet and nutrition specialist (dietitian) to make a meal plan that is best for you. Your meal plan may vary depending on factors such as:  The calories you need.  The medicines you take.  Your weight.  Your blood glucose, blood pressure, and cholesterol levels.  Your activity level.  Other health conditions you have, such as heart or kidney disease. How do carbohydrates affect me? Carbohydrates, also called carbs, affect your blood glucose level more than any other type of food. Eating carbs naturally raises the amount of glucose in your blood. Carb counting is a method for keeping track of how many carbs you eat. Counting carbs is important to keep your blood glucose at a healthy level, especially if you use insulin or take certain oral diabetes medicines. It is important to know how many carbs you can safely have in each meal. This is different for every person. Your dietitian can help you calculate how many carbs you should have at each meal and for each snack. Foods that contain carbs include:  Bread, cereal, rice, pasta, and crackers.  Potatoes and corn.  Peas,  beans, and lentils.  Milk and yogurt.  Fruit and juice.  Desserts, such as cakes, cookies, ice cream, and candy. How does alcohol affect me? Alcohol can cause a sudden decrease in blood glucose (hypoglycemia), especially if you use insulin or take certain oral diabetes medicines. Hypoglycemia can be a life-threatening condition. Symptoms of hypoglycemia (sleepiness, dizziness, and confusion) are similar to symptoms of having too much alcohol. If your health care provider says that alcohol is safe for you, follow these guidelines:  Limit alcohol intake to no more than 1 drink per day for nonpregnant women and 2 drinks per day for men. One drink equals 12 oz of beer, 5 oz of wine, or 1 oz of hard liquor.  Do not drink on an empty stomach.  Keep yourself hydrated with water, diet soda, or unsweetened iced tea.  Keep in mind that regular soda, juice, and other mixers may contain a lot of sugar and must be counted as carbs. What are tips for following this plan?  Reading food labels  Start by checking the serving size on the "Nutrition Facts" label of packaged foods and drinks. The amount of calories, carbs, fats, and other nutrients listed on the label is based on one serving of the item. Many items contain more than one serving per package.  Check the total grams (g) of carbs in one serving. You can calculate the number of servings of carbs in one serving by dividing the total carbs by 15. For example, if a food has 30 g of total carbs,  it would be equal to 2 servings of carbs.  Check the number of grams (g) of saturated and trans fats in one serving. Choose foods that have low or no amount of these fats.  Check the number of milligrams (mg) of salt (sodium) in one serving. Most people should limit total sodium intake to less than 2,300 mg per day.  Always check the nutrition information of foods labeled as "low-fat" or "nonfat". These foods may be higher in added sugar or refined carbs  and should be avoided.  Talk to your dietitian to identify your daily goals for nutrients listed on the label. Shopping  Avoid buying canned, premade, or processed foods. These foods tend to be high in fat, sodium, and added sugar.  Shop around the outside edge of the grocery store. This includes fresh fruits and vegetables, bulk grains, fresh meats, and fresh dairy. Cooking  Use low-heat cooking methods, such as baking, instead of high-heat cooking methods like deep frying.  Cook using healthy oils, such as olive, canola, or sunflower oil.  Avoid cooking with butter, cream, or high-fat meats. Meal planning  Eat meals and snacks regularly, preferably at the same times every day. Avoid going long periods of time without eating.  Eat foods high in fiber, such as fresh fruits, vegetables, beans, and whole grains. Talk to your dietitian about how many servings of carbs you can eat at each meal.  Eat 4-6 ounces (oz) of lean protein each day, such as lean meat, chicken, fish, eggs, or tofu. One oz of lean protein is equal to: ? 1 oz of meat, chicken, or fish. ? 1 egg. ?  cup of tofu.  Eat some foods each day that contain healthy fats, such as avocado, nuts, seeds, and fish. Lifestyle  Check your blood glucose regularly.  Exercise regularly as told by your health care provider. This may include: ? 150 minutes of moderate-intensity or vigorous-intensity exercise each week. This could be brisk walking, biking, or water aerobics. ? Stretching and doing strength exercises, such as yoga or weightlifting, at least 2 times a week.  Take medicines as told by your health care provider.  Do not use any products that contain nicotine or tobacco, such as cigarettes and e-cigarettes. If you need help quitting, ask your health care provider.  Work with a Social worker or diabetes educator to identify strategies to manage stress and any emotional and social challenges. Questions to ask a health care  provider  Do I need to meet with a diabetes educator?  Do I need to meet with a dietitian?  What number can I call if I have questions?  When are the best times to check my blood glucose? Where to find more information:  American Diabetes Association: diabetes.org  Academy of Nutrition and Dietetics: www.eatright.[employer] of Diabetes and Digestive and Kidney Diseases (NIH): [employer] Summary  A healthy meal plan will help you control your blood glucose and maintain a healthy lifestyle.  Working with a diet and nutrition specialist (dietitian) can help you make a meal plan that is best for you.  Keep in mind that carbohydrates (carbs) and alcohol have immediate effects on your blood glucose levels. It is important to count carbs and to use alcohol carefully. This information is not intended to replace advice given to you by your health care provider. Make sure you discuss any questions you have with your health care provider. Document Revised: 03/04/2017 Document Reviewed: 04/26/2016 Elsevier Patient Education  2020 Elsevier  Inc.  Steps to Quit Smoking Smoking tobacco is the leading cause of preventable death. It can affect almost every organ in the body. Smoking puts you and people around you at risk for many serious, long-lasting (chronic) diseases. Quitting smoking can be hard, but it is one of the best things that you can do for your health. It is never too late to quit. How do I get ready to quit? When you decide to quit smoking, make a plan to help you succeed. Before you quit:  Pick a date to quit. Set a date within the next 2 weeks to give you time to prepare.  Write down the reasons why you are quitting. Keep this list in places where you will see it often.  Tell your family, friends, and co-workers that you are quitting. Their support is important.  Talk with your doctor about the choices that may help you quit.  Find out if your health  insurance will pay for these treatments.  Know the people, places, things, and activities that make you want to smoke (triggers). Avoid them. What first steps can I take to quit smoking?  Throw away all cigarettes at home, at work, and in your car.  Throw away the things that you use when you smoke, such as ashtrays and lighters.  Clean your car. Make sure to empty the ashtray.  Clean your home, including curtains and carpets. What can I do to help me quit smoking? Talk with your doctor about taking medicines and seeing a counselor at the same time. You are more likely to succeed when you do both.  If you are pregnant or breastfeeding, talk with your doctor about counseling or other ways to quit smoking. Do not take medicine to help you quit smoking unless your doctor tells you to do so. To quit smoking: Quit right away  Quit smoking totally, instead of slowly cutting back on how much you smoke over a period of time.  Go to counseling. You are more likely to quit if you go to counseling sessions regularly. Take medicine You may take medicines to help you quit. Some medicines need a prescription, and some you can buy over-the-counter. Some medicines may contain a drug called nicotine to replace the nicotine in cigarettes. Medicines may:  Help you to stop having the desire to smoke (cravings).  Help to stop the problems that come when you stop smoking (withdrawal symptoms). Your doctor may ask you to use:  Nicotine patches, gum, or lozenges.  Nicotine inhalers or sprays.  Non-nicotine medicine that is taken by mouth. Find resources Find resources and other ways to help you quit smoking and remain smoke-free after you quit. These resources are most helpful when you use them often. They include:  Online chats with a Social worker.  Phone quitlines.  Printed Furniture conservator/restorer.  Support groups or group counseling.  Text messaging programs.  Mobile phone apps. Use apps on your  mobile phone or tablet that can help you stick to your quit plan. There are many free apps for mobile phones and tablets as well as websites. Examples include Quit Guide from the [employer] and smokefree.gov  What things can I do to make it easier to quit?   Talk to your family and friends. Ask them to support and encourage you.  Call a phone quitline (1-800-QUIT-NOW), reach out to support groups, or work with a Social worker.  Ask people who smoke to not smoke around you.  Avoid places that make you  want to smoke, such as: ? Bars. ? Parties. ? Smoke-break areas at work.  Spend time with people who do not smoke.  Lower the stress in your life. Stress can make you want to smoke. Try these things to help your stress: ? Getting regular exercise. ? Doing deep-breathing exercises. ? Doing yoga. ? Meditating. ? Doing a body scan. To do this, close your eyes, focus on one area of your body at a time from head to toe. Notice which parts of your body are tense. Try to relax the muscles in those areas. How will I feel when I quit smoking? Day 1 to 3 weeks Within the first 24 hours, you may start to have some problems that come from quitting tobacco. These problems are very bad 2-3 days after you quit, but they do not often last for more than 2-3 weeks. You may get these symptoms:  Mood swings.  Feeling restless, nervous, angry, or annoyed.  Trouble concentrating.  Dizziness.  Strong desire for high-sugar foods and nicotine.  Weight gain.  Trouble pooping (constipation).  Feeling like you may vomit (nausea).  Coughing or a sore throat.  Changes in how the medicines that you take for other issues work in your body.  Depression.  Trouble sleeping (insomnia). Week 3 and afterward After the first 2-3 weeks of quitting, you may start to notice more positive results, such as:  Better sense of smell and taste.  Less coughing and sore throat.  Slower heart rate.  Lower blood  pressure.  Clearer skin.  Better breathing.  Fewer sick days. Quitting smoking can be hard. Do not give up if you fail the first time. Some people need to try a few times before they succeed. Do your best to stick to your quit plan, and talk with your doctor if you have any questions or concerns. Summary  Smoking tobacco is the leading cause of preventable death. Quitting smoking can be hard, but it is one of the best things that you can do for your health.  When you decide to quit smoking, make a plan to help you succeed.  Quit smoking right away, not slowly over a period of time.  When you start quitting, seek help from your doctor, family, or friends. This information is not intended to replace advice given to you by your health care provider. Make sure you discuss any questions you have with your health care provider. Document Revised: 12/15/2018 Document Reviewed: 06/10/2018 Elsevier Patient Education  [place].

## 2020-03-12 ENCOUNTER — Other Ambulatory Visit: Payer: Self-pay | Admitting: Nurse Practitioner

## 2020-04-11 ENCOUNTER — Ambulatory Visit: Payer: Self-pay | Admitting: Nurse Practitioner

## 2020-04-28 ENCOUNTER — Ambulatory Visit (INDEPENDENT_AMBULATORY_CARE_PROVIDER_SITE_OTHER): Payer: Self-pay | Admitting: Nurse Practitioner

## 2020-04-28 ENCOUNTER — Encounter: Payer: Self-pay | Admitting: Nurse Practitioner

## 2020-04-28 ENCOUNTER — Other Ambulatory Visit: Payer: Self-pay

## 2020-04-28 VITALS — BP 138/76 | HR 72 | Temp 100.0°F

## 2020-04-28 DIAGNOSIS — R7309 Other abnormal glucose: Secondary | ICD-10-CM

## 2020-04-28 DIAGNOSIS — F17209 Nicotine dependence, unspecified, with unspecified nicotine-induced disorders: Secondary | ICD-10-CM

## 2020-04-28 DIAGNOSIS — E119 Type 2 diabetes mellitus without complications: Secondary | ICD-10-CM

## 2020-04-28 LAB — POCT GLYCOSYLATED HEMOGLOBIN (HGB A1C): HbA1c, POC (controlled diabetic range): 5.9 % (ref 0.0–7.0)

## 2020-04-28 MED ORDER — SIMVASTATIN 10 MG PO TABS: 10.0000 mg | ORAL_TABLET | Freq: Every day | ORAL | 3 refills | Status: DC

## 2020-04-28 NOTE — Progress Notes (Signed)
Mitchell Steele, [place]  [address] Phone:  [phone]   Fax:  [phone]   Established Patient Office Visit  Subjective:  Patient ID: Mitchell Steele, male    DOB: 09/17/1995  Age: 25 y.o. MRN: [id number]  CC: No chief complaint on file.   HPI Mitchell Steele presents for follow up. He  has a past medical history of Eczema, Migraine, and Obesity.   Diabetes Mellitus Patient presents for follow up of diabetes. Current symptoms include: none. Symptoms have stabilized. Patient denies foot ulcerations, hypoglycemia , nausea, paresthesia of the feet, visual disturbances and vomiting. Evaluation to date has included: fasting blood sugar, fasting lipid panel, hemoglobin A1C and microalbuminuria.  Home sugars: BGs consistently in an acceptable range. Current treatment: more intensive attention to diet which has been effective, Continued metformin which has been effective and Continued ACE inhibitor/ARB which has been effective. Last dilated eye exam: To be completed when insurance secured.  Past Medical History:  Diagnosis Date   Eczema    Migraine    Obesity     History reviewed. No pertinent surgical history.  Family History  Problem Relation Age of Onset   Diabetes Mother    Diabetes Maternal Grandmother    Hypertension Maternal Grandmother    Heart attack Maternal Grandmother    Heart disease Maternal Grandmother    Alcohol abuse Maternal Uncle     Social History   Socioeconomic History   Marital status: Single    Spouse name: Not on file   Number of children: Not on file   Years of education: Not on file   Highest education level: Not on file  Occupational History   Occupation: Student    Comment: 10/11th grade at [employer] Use   Smoking status: Current Every Day Smoker    Packs/day: 0.00    Types: Cigars, Cigarettes   Smokeless tobacco: Never Used  Substance and Sexual Activity   Alcohol use: No     Alcohol/week: 0.0 standard drinks   Drug use: No   Sexual activity: Not on file  Other Topics Concern   Not on file  Social History Narrative   Mother is Mitchell Steele   Social Determinants of Health   Financial Resource Strain: Not on file  Food Insecurity: Not on file  Transportation Needs: Not on file  Physical Activity: Not on file  Stress: Not on file  Social Connections: Not on file  Intimate Partner Violence: Not on file    Outpatient Medications Prior to Visit  Medication Sig Dispense Refill   ibuprofen (ADVIL,MOTRIN) 800 MG tablet Take 1 tablet (800 mg total) by mouth 3 (three) times daily. (Patient taking differently: Take 800 mg by mouth every 8 (eight) hours as needed for mild pain. ) 21 tablet 0   lisinopril (ZESTRIL) 10 MG tablet Take 1 tablet (10 mg total) by mouth daily. 90 tablet 3   metFORMIN (GLUCOPHAGE) 500 MG tablet Take 1 tablet (500 mg total) by mouth 2 (two) times daily with a meal. 60 tablet 3   triamcinolone cream (KENALOG) 0.1 % Apply 1 application topically 2 (two) times daily. 453.6 g 0   No facility-administered medications prior to visit.    No Known Allergies  ROS Review of Systems    Objective:    Physical Exam Constitutional:      General: He is not in acute distress.    Appearance: He is obese. He is not toxic-appearing.  HENT:  Head: Normocephalic and atraumatic.  Cardiovascular:     Rate and Rhythm: Normal rate and regular rhythm.     Pulses: Normal pulses.     Heart sounds: Normal heart sounds.  Pulmonary:     Effort: Pulmonary effort is normal.     Breath sounds: Normal breath sounds.  Abdominal:     Palpations: Abdomen is soft.     Comments: Increased abdominal girth  Musculoskeletal:        General: Normal range of motion.     Cervical back: Normal range of motion.  Skin:    General: Skin is warm and dry.     Capillary Refill: Capillary refill takes less than 2 seconds.  Neurological:     General: No  focal deficit present.     Mental Status: He is alert and oriented to person, place, and time.  Psychiatric:        Mood and Affect: Mood normal.        Behavior: Behavior normal.        Thought Content: Thought content normal.        Judgment: Judgment normal.     There were no vitals taken for this visit. Wt Readings from Last 3 Encounters:  02/18/20 278 lb 6.4 oz (126.3 kg)  01/04/20 272 lb (123.4 kg)  12/27/19 270 lb (122.5 kg)     There are no preventive care reminders to display for this patient.  There are no preventive care reminders to display for this patient.  No results found for: TSH Lab Results  Component Value Date   WBC 5.6 12/27/2019   HGB 14.5 12/27/2019   HCT 42.3 12/27/2019   MCV 79 12/27/2019   PLT 286 12/27/2019   Lab Results  Component Value Date   NA 142 12/27/2019   K 4.4 12/27/2019   CO2 25 12/27/2019   GLUCOSE 83 12/27/2019   BUN 6 12/27/2019   CREATININE 0.83 12/27/2019   BILITOT 0.7 12/27/2019   ALKPHOS 66 12/27/2019   AST 27 12/27/2019   ALT 67 (H) 12/27/2019   PROT 7.2 12/27/2019   ALBUMIN 4.4 12/27/2019   CALCIUM 9.2 12/27/2019   ANIONGAP 8 12/16/2019   Lab Results  Component Value Date   CHOL 117 12/11/2019   Lab Results  Component Value Date   HDL 22 (L) 12/11/2019   Lab Results  Component Value Date   LDLCALC 67 12/11/2019   Lab Results  Component Value Date   TRIG 138 12/11/2019   Lab Results  Component Value Date   CHOLHDL 5.3 12/11/2019   Lab Results  Component Value Date   HGBA1C 5.9 04/28/2020      Assessment & Plan:   Problem List Items Addressed This Visit      Endocrine   Type 2 diabetes mellitus without complication, without long-term current use of insulin (Pump Back) - Primary A1C remains at goal with current therapy  Will continue to monitor closely due to obesity  Lifestyle modification with healthy diet (fewer calories, more high fiber foods, whole grains and non-starchy vegetables, lower fat  meat and fish, low-fat diary include healthy oils) regular exercise (physical activity) and weight loss Opthalmology exam discussed  Regular dental visits encouraged     Relevant Medications   simvastatin (ZOCOR) 10 MG tablet   Other Relevant Orders   POCT glycosylated hemoglobin (Hb A1C) (Completed)    Other Visit Diagnoses    Tobacco use disorder, continuous    Improving down to one cigarette per  day     Hemoglobin A1c less than 7.0%     Continue to encourage lifestyle modification       Meds ordered this encounter  Medications   simvastatin (ZOCOR) 10 MG tablet    Sig: Take 1 tablet (10 mg total) by mouth at bedtime.    Dispense:  90 tablet    Refill:  3    Order Specific Question:   Supervising Provider    Answer:   Tresa Garter [id number]    Follow-up: Return in about 3 months (around 07/27/2020) for follow up [id number].    Vevelyn Francois, NP

## 2020-04-28 NOTE — Progress Notes (Signed)
Integrated Behavioral Health Case Management Referral Note  04/28/2020 Name: Mitchell Steele MRN: [id number] DOB: 25-Dec-1995 Mitchell Steele is a 25 y.o. year old male who sees Vevelyn Francois, NP for primary care. LCSW was consulted to assess patient's needs and assist the patient with Financial Difficulties related to lack of health coverage.  Interpreter: No.   Interpreter Name & Language: none  Assessment: Patient experiencing Financial constraints related to lack of health coverage. Patient works but has low income. Patient may be eligible for [facility].  Intervention: Provided patient with [employer] and [employer]. Reviewed application and advised patient on supporting documents to be submitted with the application. Advised patient to follow up with CSW for assistance in scheduling appointment with financial counselor at [facility] and [facility]) to submit the application if needed. Provided [place] and CSW contact information.   Review of patient status, including review of consultants reports, relevant laboratory and other test results, and collaboration with appropriate care team members and the patient's provider was performed as part of comprehensive patient evaluation and provision of services.    SDOH (Social Determinants of Health) assessments performed: No   Goals Addressed   None      Follow up Plan: 1.CSW available from clinic as needed  Estanislado Emms, [place] [phone]

## 2020-04-28 NOTE — Patient Instructions (Signed)
Steps to Quit Smoking Smoking tobacco is the leading cause of preventable death. It can affect almost every organ in the body. Smoking puts you and people around you at risk for many serious, long-lasting (chronic) diseases. Quitting smoking can be hard, but it is one of the best things that you can do for your health. It is never too late to quit. How do I get ready to quit? When you decide to quit smoking, make a plan to help you succeed. Before you quit:  Pick a date to quit. Set a date within the next 2 weeks to give you time to prepare.  Write down the reasons why you are quitting. Keep this list in places where you will see it often.  Tell your family, friends, and co-workers that you are quitting. Their support is important.  Talk with your doctor about the choices that may help you quit.  Find out if your health insurance will pay for these treatments.  Know the people, places, things, and activities that make you want to smoke (triggers). Avoid them. What first steps can I take to quit smoking?  Throw away all cigarettes at home, at work, and in your car.  Throw away the things that you use when you smoke, such as ashtrays and lighters.  Clean your car. Make sure to empty the ashtray.  Clean your home, including curtains and carpets. What can I do to help me quit smoking? Talk with your doctor about taking medicines and seeing a counselor at the same time. You are more likely to succeed when you do both.  If you are pregnant or breastfeeding, talk with your doctor about counseling or other ways to quit smoking. Do not take medicine to help you quit smoking unless your doctor tells you to do so. To quit smoking: Quit right away  Quit smoking totally, instead of slowly cutting back on how much you smoke over a period of time.  Go to counseling. You are more likely to quit if you go to counseling sessions regularly. Take medicine You may take medicines to help you quit. Some  medicines need a prescription, and some you can buy over-the-counter. Some medicines may contain a drug called nicotine to replace the nicotine in cigarettes. Medicines may:  Help you to stop having the desire to smoke (cravings).  Help to stop the problems that come when you stop smoking (withdrawal symptoms). Your doctor may ask you to use:  Nicotine patches, gum, or lozenges.  Nicotine inhalers or sprays.  Non-nicotine medicine that is taken by mouth. Find resources Find resources and other ways to help you quit smoking and remain smoke-free after you quit. These resources are most helpful when you use them often. They include:  Online chats with a counselor.  Phone quitlines.  Printed self-help materials.  Support groups or group counseling.  Text messaging programs.  Mobile phone apps. Use apps on your mobile phone or tablet that can help you stick to your quit plan. There are many free apps for mobile phones and tablets as well as websites. Examples include Quit Guide from the CDC and smokefree.gov   What things can I do to make it easier to quit?  Talk to your family and friends. Ask them to support and encourage you.  Call a phone quitline (1-800-QUIT-NOW), reach out to support groups, or work with a counselor.  Ask people who smoke to not smoke around you.  Avoid places that make you want to smoke,   such as: ? Bars. ? Parties. ? Smoke-break areas at work.  Spend time with people who do not smoke.  Lower the stress in your life. Stress can make you want to smoke. Try these things to help your stress: ? Getting regular exercise. ? Doing deep-breathing exercises. ? Doing yoga. ? Meditating. ? Doing a body scan. To do this, close your eyes, focus on one area of your body at a time from head to toe. Notice which parts of your body are tense. Try to relax the muscles in those areas.   How will I feel when I quit smoking? Day 1 to 3 weeks Within the first 24 hours,  you may start to have some problems that come from quitting tobacco. These problems are very bad 2-3 days after you quit, but they do not often last for more than 2-3 weeks. You may get these symptoms:  Mood swings.  Feeling restless, nervous, angry, or annoyed.  Trouble concentrating.  Dizziness.  Strong desire for high-sugar foods and nicotine.  Weight gain.  Trouble pooping (constipation).  Feeling like you may vomit (nausea).  Coughing or a sore throat.  Changes in how the medicines that you take for other issues work in your body.  Depression.  Trouble sleeping (insomnia). Week 3 and afterward After the first 2-3 weeks of quitting, you may start to notice more positive results, such as:  Better sense of smell and taste.  Less coughing and sore throat.  Slower heart rate.  Lower blood pressure.  Clearer skin.  Better breathing.  Fewer sick days. Quitting smoking can be hard. Do not give up if you fail the first time. Some people need to try a few times before they succeed. Do your best to stick to your quit plan, and talk with your doctor if you have any questions or concerns. Summary  Smoking tobacco is the leading cause of preventable death. Quitting smoking can be hard, but it is one of the best things that you can do for your health.  When you decide to quit smoking, make a plan to help you succeed.  Quit smoking right away, not slowly over a period of time.  When you start quitting, seek help from your doctor, family, or friends. This information is not intended to replace advice given to you by your health care provider. Make sure you discuss any questions you have with your health care provider. Document Revised: 12/15/2018 Document Reviewed: 06/10/2018 Elsevier Patient Education  [place].   Diabetes Mellitus and Exercise Exercising regularly is important for overall health, especially for people who have diabetes mellitus. Exercising is  not only about losing weight. It has many other health benefits, such as increasing muscle strength and bone density and reducing body fat and stress. This leads to improved fitness, flexibility, and endurance, all of which result in better overall health. What are the benefits of exercise if I have diabetes? Exercise has many benefits for people with diabetes. They include:  Helping to lower and control blood sugar (glucose).  Helping the body to respond better to the hormone insulin by improving insulin sensitivity.  Reducing how much insulin the body needs.  Lowering the risk for heart disease by: ? Lowering "bad" cholesterol and triglyceride levels. ? Increasing "good" cholesterol levels. ? Lowering blood pressure. ? Lowering blood glucose levels. What is my activity plan? Your health care provider or certified diabetes educator can help you make a plan for the type and frequency of  exercise that works for you. This is called your activity plan. Be sure to:  Get at least 150 minutes of medium-intensity or high-intensity exercise each week. Exercises may include brisk walking, biking, or water aerobics.  Do stretching and strengthening exercises, such as yoga or weight lifting, at least 2 times a week.  Spread out your activity over at least 3 days of the week.  Get some form of physical activity each day. ? Do not go more than 2 days in a row without some kind of physical activity. ? Avoid being inactive for more than 90 minutes at a time. Take frequent breaks to walk or stretch.  Choose exercises or activities that you enjoy. Set realistic goals.  Start slowly and gradually increase your exercise intensity over time.   How do I manage my diabetes during exercise? Monitor your blood glucose  Check your blood glucose before and after exercising. If your blood glucose is: ? 240 mg/dL (13.3 mmol/L) or higher before you exercise, check your urine for ketones. These are chemicals  created by the liver. If you have ketones in your urine, do not exercise until your blood glucose returns to normal. ? 100 mg/dL (5.6 mmol/L) or lower, eat a snack containing 15-20 grams of carbohydrate. Check your blood glucose 15 minutes after the snack to make sure that your glucose level is above 100 mg/dL (5.6 mmol/L) before you start your exercise.  Know the symptoms of low blood glucose (hypoglycemia) and how to treat it. Your risk for hypoglycemia increases during and after exercise. Follow these tips and your health care provider's instructions  Keep a carbohydrate snack that is fast-acting for use before, during, and after exercise to help prevent or treat hypoglycemia.  Avoid injecting insulin into areas of the body that are going to be exercised. For example, avoid injecting insulin into: ? Your arms, when you are about to play tennis. ? Your legs, when you are about to go jogging.  Keep records of your exercise habits. Doing this can help you and your health care provider adjust your diabetes management plan as needed. Write down: ? Food that you eat before and after you exercise. ? Blood glucose levels before and after you exercise. ? The type and amount of exercise you have done.  Work with your health care provider when you start a new exercise or activity. He or she may need to: ? Make sure that the activity is safe for you. ? Adjust your insulin, other medicines, and food that you eat.  Drink plenty of water while you exercise. This prevents loss of water (dehydration) and problems caused by a lot of heat in the body (heat stroke).   Where to find more information  American Diabetes Association: www.diabetes.org Summary  Exercising regularly is important for overall health, especially for people who have diabetes mellitus.  Exercising has many health benefits. It increases muscle strength and bone density and reduces body fat and stress. It also lowers and controls  blood glucose.  Your health care provider or certified diabetes educator can help you make an activity plan for the type and frequency of exercise that works for you.  Work with your health care provider to make sure any new activity is safe for you. Also work with your health care provider to adjust your insulin, other medicines, and the food you eat. This information is not intended to replace advice given to you by your health care provider. Make sure you discuss any  questions you have with your health care provider. Document Revised: 12/18/2018 Document Reviewed: 12/18/2018 Elsevier Patient Education  2021 [employer].

## 2020-07-21 ENCOUNTER — Ambulatory Visit (INDEPENDENT_AMBULATORY_CARE_PROVIDER_SITE_OTHER): Payer: Self-pay | Admitting: Nurse Practitioner

## 2020-07-21 ENCOUNTER — Other Ambulatory Visit: Payer: Self-pay

## 2020-07-21 ENCOUNTER — Encounter: Payer: Self-pay | Admitting: Nurse Practitioner

## 2020-07-21 VITALS — BP 127/68 | HR 67 | Temp 98.8°F | Ht 65.0 in | Wt 297.0 lb

## 2020-07-21 DIAGNOSIS — I152 Hypertension secondary to endocrine disorders: Secondary | ICD-10-CM

## 2020-07-21 DIAGNOSIS — E119 Type 2 diabetes mellitus without complications: Secondary | ICD-10-CM

## 2020-07-21 DIAGNOSIS — Z6841 Body Mass Index (BMI) 40.0 and over, adult: Secondary | ICD-10-CM

## 2020-07-21 DIAGNOSIS — F17209 Nicotine dependence, unspecified, with unspecified nicotine-induced disorders: Secondary | ICD-10-CM

## 2020-07-21 LAB — POCT URINALYSIS DIPSTICK
Bilirubin, UA: NEGATIVE
Blood, UA: NEGATIVE
Glucose, UA: NEGATIVE
Ketones, UA: NEGATIVE
Leukocytes, UA: NEGATIVE
Nitrite, UA: NEGATIVE
Protein, UA: NEGATIVE
Spec Grav, UA: 1.025 (ref 1.010–1.025)
Urobilinogen, UA: NEGATIVE E.U./dL — AB
pH, UA: 7 (ref 5.0–8.0)

## 2020-07-21 LAB — POCT GLYCOSYLATED HEMOGLOBIN (HGB A1C)
HbA1c POC (<> result, manual entry): 6 % (ref 4.0–5.6)
HbA1c, POC (controlled diabetic range): 6 % (ref 0.0–7.0)
HbA1c, POC (prediabetic range): 6 % (ref 5.7–6.4)
Hemoglobin A1C: 6 % — AB (ref 4.0–5.6)

## 2020-07-21 MED ORDER — SIMVASTATIN 5 MG PO TABS: 5.0000 mg | ORAL_TABLET | Freq: Every day | ORAL | 3 refills | Status: DC

## 2020-07-21 MED ORDER — METFORMIN HCL 500 MG PO TABS: 500.0000 mg | ORAL_TABLET | Freq: Two times a day (BID) | ORAL | 3 refills | Status: DC

## 2020-07-21 MED ORDER — LISINOPRIL 10 MG PO TABS
10.0000 mg | ORAL_TABLET | Freq: Every day | ORAL | 3 refills | Status: DC
Start: 2020-07-21 — End: 2021-08-21

## 2020-07-21 NOTE — Progress Notes (Signed)
[place], [place]  [address] Phone:  [phone]   Fax:  [phone]   Established Patient Office Visit  Subjective:  Patient ID: Mitchell Steele, male    DOB: Feb 05, 1996  Age: 25 y.o. MRN: [id number]  CC:  Chief Complaint  Patient presents with  . Diabetes    Follow up for diabetes     HPI Mitchell Steele presents for follow up. He  has a past medical history of Eczema, Migraine, and Obesity.    Diabetes Mellitus Patient presents for follow up of diabetes. Current symptoms include: none. Symptoms have been well-controlled. Patient denies foot ulcerations, hypoglycemia , nausea, paresthesia of the feet, polydipsia, polyuria, visual disturbances, vomiting and weight loss. Evaluation to date has included: fasting blood sugar, fasting lipid panel, hemoglobin A1C and microalbuminuria.  Home sugars: BGs are running  consistent with Hgb A1C. Current treatment: more intensive attention to diet which has been not very effective, Continued metformin which has been effective and Continued ACE inhibitor/ARB which has been effective.  He admits that he is waiting to start his [employer]. Past Medical History:  Diagnosis Date  . Eczema   . Migraine   . Obesity     History reviewed. No pertinent surgical history.  Family History  Problem Relation Age of Onset  . Diabetes Mother   . Diabetes Maternal Grandmother   . Hypertension Maternal Grandmother   . Heart attack Maternal Grandmother   . Heart disease Maternal Grandmother   . Alcohol abuse Maternal Uncle     Social History   Socioeconomic History  . Marital status: Single    Spouse name: Not on file  . Number of children: Not on file  . Years of education: Not on file  . Highest education level: Not on file  Occupational History  . Occupation: Student    Comment: 10/11th grade at [employer]  . Smoking status: Current Every Day Smoker    Packs/day: 0.00    Types:  Cigars, Cigarettes  . Smokeless tobacco: Never Used  Substance and Sexual Activity  . Alcohol use: No    Alcohol/week: 0.0 standard drinks  . Drug use: No  . Sexual activity: Not on file  Other Topics Concern  . Not on file  Social History Narrative   Mother is Mitchell Steele   Social Determinants of Health   Financial Resource Strain: Not on file  Food Insecurity: Not on file  Transportation Needs: Not on file  Physical Activity: Not on file  Stress: Not on file  Social Connections: Not on file  Intimate Partner Violence: Not on file    Outpatient Medications Prior to Visit  Medication Sig Dispense Refill  . ibuprofen (ADVIL,MOTRIN) 800 MG tablet Take 1 tablet (800 mg total) by mouth 3 (three) times daily. (Patient taking differently: Take 800 mg by mouth every 8 (eight) hours as needed for mild pain.) 21 tablet 0  . triamcinolone cream (KENALOG) 0.1 % Apply 1 application topically 2 (two) times daily. 453.6 g 0  . metFORMIN (GLUCOPHAGE) 500 MG tablet Take 1 tablet (500 mg total) by mouth 2 (two) times daily with a meal. 60 tablet 3  . lisinopril (ZESTRIL) 10 MG tablet Take 1 tablet (10 mg total) by mouth daily. (Patient not taking: Reported on 07/21/2020) 90 tablet 3  . simvastatin (ZOCOR) 10 MG tablet Take 1 tablet (10 mg total) by mouth at bedtime. 90 tablet 3   No facility-administered  medications prior to visit.    No Known Allergies  ROS Review of Systems  Respiratory: Negative for shortness of breath.   Cardiovascular: Negative for chest pain and leg swelling.  Gastrointestinal: Positive for diarrhea. Negative for constipation and nausea.  Neurological: Negative for dizziness and headaches.      Objective:    Physical Exam Constitutional:      Appearance: He is obese.  HENT:     Head: Normocephalic and atraumatic.     Nose: Nose normal.     Mouth/Throat:     Mouth: Mucous membranes are moist.  Cardiovascular:     Rate and Rhythm: Normal rate and regular  rhythm.     Pulses: Normal pulses.  Musculoskeletal:     Cervical back: Normal range of motion.  Skin:    General: Skin is warm and dry.     Capillary Refill: Capillary refill takes less than 2 seconds.  Neurological:     General: No focal deficit present.     Mental Status: He is alert and oriented to person, place, and time.  Psychiatric:        Mood and Affect: Mood normal.        Behavior: Behavior normal.        Thought Content: Thought content normal.        Judgment: Judgment normal.     BP 127/68 (BP Location: Left Arm, Patient Position: Sitting, Cuff Size: Large)   Pulse 67   Temp 98.8 F (37.1 C) (Temporal)   Ht 5\' 5"  (1.651 m)   Wt 297 lb (134.7 kg)   SpO2 97%   BMI 49.42 kg/m  Wt Readings from Last 3 Encounters:  07/21/20 297 lb (134.7 kg)  02/18/20 278 lb 6.4 oz (126.3 kg)  01/04/20 272 lb (123.4 kg)     There are no preventive care reminders to display for this patient.  There are no preventive care reminders to display for this patient.  No results found for: TSH Lab Results  Component Value Date   WBC 5.6 12/27/2019   HGB 14.5 12/27/2019   HCT 42.3 12/27/2019   MCV 79 12/27/2019   PLT 286 12/27/2019   Lab Results  Component Value Date   NA 142 12/27/2019   K 4.4 12/27/2019   CO2 25 12/27/2019   GLUCOSE 83 12/27/2019   BUN 6 12/27/2019   CREATININE 0.83 12/27/2019   BILITOT 0.7 12/27/2019   ALKPHOS 66 12/27/2019   AST 27 12/27/2019   ALT 67 (H) 12/27/2019   PROT 7.2 12/27/2019   ALBUMIN 4.4 12/27/2019   CALCIUM 9.2 12/27/2019   ANIONGAP 8 12/16/2019   Lab Results  Component Value Date   CHOL 117 12/11/2019   Lab Results  Component Value Date   HDL 22 (L) 12/11/2019   Lab Results  Component Value Date   LDLCALC 67 12/11/2019   Lab Results  Component Value Date   TRIG 138 12/11/2019   Lab Results  Component Value Date   CHOLHDL 5.3 12/11/2019   Lab Results  Component Value Date   HGBA1C 6.0 (A) 07/21/2020   HGBA1C 6.0  07/21/2020   HGBA1C 6.0 07/21/2020   HGBA1C 6.0 07/21/2020      Assessment & Plan:   Problem List Items Addressed This Visit      Endocrine   Type 2 diabetes mellitus without complication, without long-term current use of insulin (HCC) - Primary Encourage compliance with current treatment regimen  Dose adjustment simvastatin 5 mg  nightly Encourage regular CBG monitoring Encourage contacting office if excessive hyperglycemia and or hypoglycemia Lifestyle modification with healthy diet (fewer calories, more high fiber foods, whole grains and non-starchy vegetables, lower fat meat and fish, low-fat diary include healthy oils) regular exercise (physical activity) and weight loss Opthalmology exam discussed  Nutritional consult recommended Regular dental visits encouraged Home BP monitoring also encouraged goal <130/80     Relevant Medications   simvastatin (ZOCOR) 5 MG tablet   metFORMIN (GLUCOPHAGE) 500 MG tablet   lisinopril (ZESTRIL) 10 MG tablet   Other Relevant Orders   POCT Urinalysis Dipstick (Completed)   HgB A1c (Completed)   Lipid panel   Comp. Metabolic Panel (12)    Other Visit Diagnoses    Tobacco use disorder, continuous  Mild persistent   Morbid obesity with BMI of 45.0-49.9, adult ([place])     Obesity with BMI and comorbidities as noted above.  Discussed proper diet (low fat, low sodium, high fiber) with patient.   Discussed need for regular exercise (3 times per week, 20 minutes per session) with patient.    Relevant Medications   metFORMIN (GLUCOPHAGE) 500 MG tablet   Hypertension due to endocrine disorder     Encouraged on going compliance with current medication regimen Encouraged home monitoring and recording BP <130/80 Eating a heart-healthy diet with less salt Encouraged regular physical activity  Recommend Weight loss    Relevant Medications   simvastatin (ZOCOR) 5 MG tablet   lisinopril (ZESTRIL) 10 MG tablet   Other Relevant Orders   Comp.  Metabolic Panel (12)      Meds ordered this encounter  Medications  . simvastatin (ZOCOR) 5 MG tablet    Sig: Take 1 tablet (5 mg total) by mouth at bedtime.    Dispense:  90 tablet    Refill:  3    Order Specific Question:   Supervising Provider    Answer:   Tresa Garter [id number]  . metFORMIN (GLUCOPHAGE) 500 MG tablet    Sig: Take 1 tablet (500 mg total) by mouth 2 (two) times daily with a meal.    Dispense:  180 tablet    Refill:  3    Order Specific Question:   Supervising Provider    Answer:   Tresa Garter [id number]  . lisinopril (ZESTRIL) 10 MG tablet    Sig: Take 1 tablet (10 mg total) by mouth daily.    Dispense:  90 tablet    Refill:  3    Order Specific Question:   Supervising Provider    Answer:   Tresa Garter [id number]    Follow-up: Return in about 3 months (around 10/20/2020) for follow up [id number].    Vevelyn Francois, NP

## 2020-07-21 NOTE — Patient Instructions (Addendum)
Diabetes Mellitus and Nutrition, Adult When you have diabetes, or diabetes mellitus, it is very important to have healthy eating habits because your blood sugar (glucose) levels are greatly affected by what you eat and drink. Eating healthy foods in the right amounts, at about the same times every day, can help you:  Control your blood glucose.  Lower your risk of heart disease.  Improve your blood pressure.  Reach or maintain a healthy weight. What can affect my meal plan? Every person with diabetes is different, and each person has different needs for a meal plan. Your health care provider may recommend that you work with a dietitian to make a meal plan that is best for you. Your meal plan may vary depending on factors such as:  The calories you need.  The medicines you take.  Your weight.  Your blood glucose, blood pressure, and cholesterol levels.  Your activity level.  Other health conditions you have, such as heart or kidney disease. How do carbohydrates affect me? Carbohydrates, also called carbs, affect your blood glucose level more than any other type of food. Eating carbs naturally raises the amount of glucose in your blood. Carb counting is a method for keeping track of how many carbs you eat. Counting carbs is important to keep your blood glucose at a healthy level, especially if you use insulin or take certain oral diabetes medicines. It is important to know how many carbs you can safely have in each meal. This is different for every person. Your dietitian can help you calculate how many carbs you should have at each meal and for each snack. How does alcohol affect me? Alcohol can cause a sudden decrease in blood glucose (hypoglycemia), especially if you use insulin or take certain oral diabetes medicines. Hypoglycemia can be a life-threatening condition. Symptoms of hypoglycemia, such as sleepiness, dizziness, and confusion, are similar to symptoms of having too much  alcohol.  Do not drink alcohol if: ? Your health care provider tells you not to drink. ? You are pregnant, may be pregnant, or are planning to become pregnant.  If you drink alcohol: ? Do not drink on an empty stomach. ? Limit how much you use to:  0-1 drink a day for women.  0-2 drinks a day for men. ? Be aware of how much alcohol is in your drink. In the [place] one drink equals one 12 oz bottle of beer (355 mL), one 5 oz glass of wine (148 mL), or one 1 oz glass of hard liquor (44 mL). ? Keep yourself hydrated with water, diet soda, or unsweetened iced tea.  Keep in mind that regular soda, juice, and other mixers may contain a lot of sugar and must be counted as carbs. What are tips for following this plan? Reading food labels  Start by checking the serving size on the "Nutrition Facts" label of packaged foods and drinks. The amount of calories, carbs, fats, and other nutrients listed on the label is based on one serving of the item. Many items contain more than one serving per package.  Check the total grams (g) of carbs in one serving. You can calculate the number of servings of carbs in one serving by dividing the total carbs by 15. For example, if a food has 30 g of total carbs per serving, it would be equal to 2 servings of carbs.  Check the number of grams (g) of saturated fats and trans fats in one serving. Choose foods that have   a low amount or none of these fats.  Check the number of milligrams (mg) of salt (sodium) in one serving. Most people should limit total sodium intake to less than 2,300 mg per day.  Always check the nutrition information of foods labeled as "low-fat" or "nonfat." These foods may be higher in added sugar or refined carbs and should be avoided.  Talk to your dietitian to identify your daily goals for nutrients listed on the label. Shopping  Avoid buying canned, pre-made, or processed foods. These foods tend to be high in fat, sodium, and added  sugar.  Shop around the outside edge of the grocery store. This is where you will most often find fresh fruits and vegetables, bulk grains, fresh meats, and fresh dairy. Cooking  Use low-heat cooking methods, such as baking, instead of high-heat cooking methods like deep frying.  Cook using healthy oils, such as olive, canola, or sunflower oil.  Avoid cooking with butter, cream, or high-fat meats. Meal planning  Eat meals and snacks regularly, preferably at the same times every day. Avoid going long periods of time without eating.  Eat foods that are high in fiber, such as fresh fruits, vegetables, beans, and whole grains. Talk with your dietitian about how many servings of carbs you can eat at each meal.  Eat 4-6 oz (112-168 g) of lean protein each day, such as lean meat, chicken, fish, eggs, or tofu. One ounce (oz) of lean protein is equal to: ? 1 oz (28 g) of meat, chicken, or fish. ? 1 egg. ?  cup (62 g) of tofu.  Eat some foods each day that contain healthy fats, such as avocado, nuts, seeds, and fish.   What foods should I eat? Fruits Berries. Apples. Oranges. Peaches. Apricots. Plums. Grapes. Mango. Papaya. Pomegranate. Kiwi. Cherries. Vegetables Lettuce. Spinach. Leafy greens, including kale, chard, collard greens, and mustard greens. Beets. Cauliflower. Cabbage. Broccoli. Carrots. Green beans. Tomatoes. Peppers. Onions. Cucumbers. Brussels sprouts. Grains Whole grains, such as whole-wheat or whole-grain bread, crackers, tortillas, cereal, and pasta. Unsweetened oatmeal. Quinoa. Brown or wild rice. Meats and other proteins Seafood. Poultry without skin. Lean cuts of poultry and beef. Tofu. Nuts. Seeds. Dairy Low-fat or fat-free dairy products such as milk, yogurt, and cheese. The items listed above may not be a complete list of foods and beverages you can eat. Contact a dietitian for more information. What foods should I avoid? Fruits Fruits canned with  syrup. Vegetables Canned vegetables. Frozen vegetables with butter or cream sauce. Grains Refined white flour and flour products such as bread, pasta, snack foods, and cereals. Avoid all processed foods. Meats and other proteins Fatty cuts of meat. Poultry with skin. Breaded or fried meats. Processed meat. Avoid saturated fats. Dairy Full-fat yogurt, cheese, or milk. Beverages Sweetened drinks, such as soda or iced tea. The items listed above may not be a complete list of foods and beverages you should avoid. Contact a dietitian for more information. Questions to ask a health care provider  Do I need to meet with a diabetes educator?  Do I need to meet with a dietitian?  What number can I call if I have questions?  When are the best times to check my blood glucose? Where to find more information:  American Diabetes Association: diabetes.org  Academy of Nutrition and Dietetics: www.eatright.org  National Institute of Diabetes and Digestive and Kidney Diseases: www.niddk.nih.gov  Association of Diabetes Care and Education Specialists: www.diabeteseducator.org Summary  It is important to have healthy eating   habits because your blood sugar (glucose) levels are greatly affected by what you eat and drink.  A healthy meal plan will help you control your blood glucose and maintain a healthy lifestyle.  Your health care provider may recommend that you work with a dietitian to make a meal plan that is best for you.  Keep in mind that carbohydrates (carbs) and alcohol have immediate effects on your blood glucose levels. It is important to count carbs and to use alcohol carefully. This information is not intended to replace advice given to you by your health care provider. Make sure you discuss any questions you have with your health care provider. Document Revised: 02/27/2019 Document Reviewed: 02/27/2019 Elsevier Patient Education  2021 Elsevier Inc.  

## 2020-07-22 LAB — COMP. METABOLIC PANEL (12)
AST: 15 IU/L (ref 0–40)
Albumin/Globulin Ratio: 1.5 (ref 1.2–2.2)
Albumin: 4.3 g/dL (ref 4.1–5.2)
Alkaline Phosphatase: 89 IU/L (ref 44–121)
BUN/Creatinine Ratio: 10 (ref 9–20)
BUN: 9 mg/dL (ref 6–20)
Bilirubin Total: 0.3 mg/dL (ref 0.0–1.2)
Calcium: 9.2 mg/dL (ref 8.7–10.2)
Chloride: 100 mmol/L (ref 96–106)
Creatinine, Ser: 0.86 mg/dL (ref 0.76–1.27)
Globulin, Total: 2.8 g/dL (ref 1.5–4.5)
Glucose: 102 mg/dL — ABNORMAL HIGH (ref 65–99)
Potassium: 4.4 mmol/L (ref 3.5–5.2)
Sodium: 141 mmol/L (ref 134–144)
Total Protein: 7.1 g/dL (ref 6.0–8.5)
eGFR: 123 mL/min/{1.73_m2} (ref >59)

## 2020-07-22 LAB — LIPID PANEL
Chol/HDL Ratio: 3.9 ratio (ref 0.0–5.0)
Cholesterol, Total: 122 mg/dL (ref 100–199)
HDL: 31 mg/dL — ABNORMAL LOW (ref >39)
LDL Chol Calc (NIH): 76 mg/dL (ref 0–99)
Triglycerides: 73 mg/dL (ref 0–149)
VLDL Cholesterol Cal: 15 mg/dL (ref 5–40)

## 2020-09-25 ENCOUNTER — Ambulatory Visit: Payer: Self-pay | Admitting: Nurse Practitioner

## 2020-10-20 ENCOUNTER — Ambulatory Visit: Payer: Self-pay | Admitting: Nurse Practitioner

## 2021-08-12 ENCOUNTER — Ambulatory Visit: Payer: Self-pay | Admitting: Nurse Practitioner

## 2021-08-17 ENCOUNTER — Encounter: Payer: Self-pay | Admitting: Nurse Practitioner

## 2021-08-17 ENCOUNTER — Ambulatory Visit (INDEPENDENT_AMBULATORY_CARE_PROVIDER_SITE_OTHER): Payer: Self-pay | Admitting: Nurse Practitioner

## 2021-08-17 VITALS — BP 132/57 | HR 77 | Temp 99.5°F | Ht 65.0 in | Wt 296.5 lb

## 2021-08-17 DIAGNOSIS — Z6841 Body Mass Index (BMI) 40.0 and over, adult: Secondary | ICD-10-CM

## 2021-08-17 DIAGNOSIS — E119 Type 2 diabetes mellitus without complications: Secondary | ICD-10-CM

## 2021-08-17 MED ORDER — METFORMIN HCL 500 MG PO TABS: 500.0000 mg | ORAL_TABLET | Freq: Two times a day (BID) | ORAL | 3 refills | Status: DC

## 2021-08-17 NOTE — Progress Notes (Signed)
@  Patient ID: Mitchell Steele, male    DOB: 16-May-1995, 26 y.o.   MRN: [id number] ? ?Chief Complaint  ?Patient presents with  ? Medication Refill  ?  Pt stated he is here for medication refill metformin.  ? ? ?Referring provider: ?Vevelyn Francois, NP ? ? ?HPI ? ?Patient presents today for follow up on diabetes. Will need refill on Metformin today. Patient would like to focus on weight loss. Will refer to medical weight management. Overall doing well. Denies f/c/s, n/v/d, hemoptysis, PND, chest pain or edema.  ? ? ? ? ? ? ? ?No Known Allergies ? ?Immunization History  ?Administered Date(s) Administered  ? HPV Quadrivalent 04/03/2012  ? Influenza, Seasonal, Injecte, Preservative Fre 04/03/2012  ? ? ?Past Medical History:  ?Diagnosis Date  ? Cancer [facility])   ? Eczema   ? Migraine   ? Obesity   ? ? ?Tobacco History: ?Social History  ? ?Tobacco Use  ?Smoking Status Former  ? Packs/day: 0.00  ? Types: Cigars, Cigarettes  ? Quit date: 08/09/2021  ? Years since quitting: 0.0  ?Smokeless Tobacco Never  ? ?Counseling given: Not Answered ? ? ?Outpatient Encounter Medications as of 08/17/2021  ?Medication Sig  ? ibuprofen (ADVIL,MOTRIN) 800 MG tablet Take 1 tablet (800 mg total) by mouth 3 (three) times daily. (Patient taking differently: Take 800 mg by mouth every 8 (eight) hours as needed for mild pain.)  ? triamcinolone cream (KENALOG) 0.1 % Apply 1 application topically 2 (two) times daily.  ? lisinopril (ZESTRIL) 10 MG tablet Take 1 tablet (10 mg total) by mouth daily. (Patient not taking: Reported on 08/17/2021)  ? metFORMIN (GLUCOPHAGE) 500 MG tablet Take 1 tablet (500 mg total) by mouth 2 (two) times daily with a meal.  ? simvastatin (ZOCOR) 5 MG tablet Take 1 tablet (5 mg total) by mouth at bedtime.  ? [DISCONTINUED] metFORMIN (GLUCOPHAGE) 500 MG tablet Take 1 tablet (500 mg total) by mouth 2 (two) times daily with a meal.  ? ?No facility-administered encounter medications on file as of 08/17/2021.  ? ? ? ?Review of  Systems ? ?Review of Systems  ? ? ? ?Physical Exam ? ?BP (!) 132/57 (BP Location: Left Arm, Patient Position: Sitting, Cuff Size: Large)   Pulse 77   Temp 99.5 ?F (37.5 ?C)   Ht '5\' 5"'$  (1.651 m)   Wt 296 lb 8 oz (134.5 kg)   SpO2 100%   BMI 49.34 kg/m?  ? ?Wt Readings from Last 5 Encounters:  ?08/17/21 296 lb 8 oz (134.5 kg)  ?07/21/20 297 lb (134.7 kg)  ?02/18/20 278 lb 6.4 oz (126.3 kg)  ?01/04/20 272 lb (123.4 kg)  ?12/27/19 270 lb (122.5 kg)  ? ? ? ?Physical Exam ? ? ? ?Assessment & Plan:  ? ?Class 3 severe obesity due to excess calories without serious comorbidity with body mass index (BMI) of 45.0 to 49.9 in adult [facility]) ?- Amb Ref to Medical Weight Management ? ?2. Type 2 diabetes mellitus without complication, without long-term current use of insulin ([place]) ? ?- metFORMIN (GLUCOPHAGE) 500 MG tablet; Take 1 tablet (500 mg total) by mouth 2 (two) times daily with a meal.  Dispense: 180 tablet; Refill: 3 ? ? ?Follow up: ? ?Follow up in 3 months ? ? ? ? ?Fenton Foy, NP ?08/19/2021 ? ?

## 2021-08-17 NOTE — Patient Instructions (Addendum)
1. Class 3 severe obesity due to excess calories without serious comorbidity with body mass index (BMI) of 45.0 to 49.9 in adult [facility]) ? ?- Amb Ref to Medical Weight Management ? ?2. Type 2 diabetes mellitus without complication, without long-term current use of insulin ([place]) ? ?- metFORMIN (GLUCOPHAGE) 500 MG tablet; Take 1 tablet (500 mg total) by mouth 2 (two) times daily with a meal.  Dispense: 180 tablet; Refill: 3 ? ? ?Follow up: ? ?Follow up in 3 months ?

## 2021-08-19 ENCOUNTER — Encounter: Payer: Self-pay | Admitting: Nurse Practitioner

## 2021-08-19 NOTE — Assessment & Plan Note (Signed)
-   Amb Ref to Medical Weight Management ? ?2. Type 2 diabetes mellitus without complication, without long-term current use of insulin ([place]) ? ?- metFORMIN (GLUCOPHAGE) 500 MG tablet; Take 1 tablet (500 mg total) by mouth 2 (two) times daily with a meal.  Dispense: 180 tablet; Refill: 3 ? ? ?Follow up: ? ?Follow up in 3 months ?

## 2021-08-20 ENCOUNTER — Other Ambulatory Visit: Payer: Self-pay | Admitting: Nurse Practitioner

## 2021-08-20 DIAGNOSIS — I152 Hypertension secondary to endocrine disorders: Secondary | ICD-10-CM

## 2021-09-01 ENCOUNTER — Telehealth: Payer: Self-pay | Admitting: Nurse Practitioner

## 2021-09-01 ENCOUNTER — Telehealth: Payer: Self-pay

## 2021-09-01 ENCOUNTER — Other Ambulatory Visit: Payer: Self-pay

## 2021-09-01 DIAGNOSIS — E119 Type 2 diabetes mellitus without complications: Secondary | ICD-10-CM

## 2021-09-01 MED ORDER — METFORMIN HCL 500 MG PO TABS: 500.0000 mg | ORAL_TABLET | Freq: Two times a day (BID) | ORAL | 3 refills | Status: DC

## 2021-09-01 NOTE — Telephone Encounter (Signed)
Copied from [place] [phone] [phone]. Topic: General - Other >> Aug 28, 2021  1:50 PM [place], [place] wrote: Reason for CRM: pt's mother called in needs to make appt for orange card , he is out of metformin and bp medicine lisinopril   Disregard the orange card portion, I spoke with pt and informed him of new fin process until we have a fin advisor here at [facility] Applications available here.

## 2021-09-01 NOTE — Telephone Encounter (Signed)
Metformin 

## 2021-09-03 ENCOUNTER — Other Ambulatory Visit: Payer: Self-pay | Admitting: Nurse Practitioner

## 2021-09-03 DIAGNOSIS — I152 Hypertension secondary to endocrine disorders: Secondary | ICD-10-CM

## 2021-09-03 DIAGNOSIS — E119 Type 2 diabetes mellitus without complications: Secondary | ICD-10-CM

## 2021-09-03 MED ORDER — METFORMIN HCL 500 MG PO TABS: 500.0000 mg | ORAL_TABLET | Freq: Two times a day (BID) | ORAL | 3 refills | Status: DC

## 2021-09-03 MED ORDER — LISINOPRIL 10 MG PO TABS: 10.0000 mg | ORAL_TABLET | Freq: Every day | ORAL | 0 refills | Status: DC

## 2021-09-03 NOTE — Telephone Encounter (Signed)
Refills sent to pharmacy. 

## 2021-10-05 ENCOUNTER — Other Ambulatory Visit: Payer: Self-pay

## 2021-10-05 ENCOUNTER — Emergency Department (HOSPITAL_COMMUNITY)
Admission: EM | Admit: 2021-10-05 | Discharge: 2021-10-06 | Disposition: A | Discharge status: Home / Self Care | Payer: Medicaid Other | Attending: Emergency Medicine | Admitting: Emergency Medicine

## 2021-10-05 ENCOUNTER — Encounter (HOSPITAL_COMMUNITY): Payer: Self-pay | Admitting: Emergency Medicine

## 2021-10-05 ENCOUNTER — Emergency Department (HOSPITAL_COMMUNITY): Payer: Medicaid Other

## 2021-10-05 DIAGNOSIS — Y9343 Activity, gymnastics: Secondary | ICD-10-CM | POA: Insufficient documentation

## 2021-10-05 DIAGNOSIS — M25512 Pain in left shoulder: Secondary | ICD-10-CM

## 2021-10-05 DIAGNOSIS — X503XXA Overexertion from repetitive movements, initial encounter: Secondary | ICD-10-CM | POA: Insufficient documentation

## 2021-10-05 NOTE — ED Provider Triage Note (Signed)
Emergency Medicine Provider Triage Evaluation Note  Mitchell Steele , a 26 y.o. male  was evaluated in triage.  Pt complains of shoulder pain after lifting injury 3 days ago.  Patient reports that he feels like he pulled something or some other injury.  He denies any numbness, tingling.  He reports that he has been icing it with no significant relief..  Review of Systems  Positive: Left shoulder pain Negative: Chest pain, shortness of breath, fever, chills, numbness, tingling  Physical Exam  BP 132/66 (BP Location: Right Arm)   Pulse 62   Temp 98.5 F (36.9 C) (Oral)   Resp 17   SpO2 95%  Gen:   Awake, no distress   Resp:  Normal effort  MSK:   Moves extremities without difficulty  Other:  Left shoulder pain  Medical Decision Making  Medically screening exam initiated at 8:40 PM.  Appropriate orders placed.  Mitchell Steele was informed that the remainder of the evaluation will be completed by another provider, this initial triage assessment does not replace that evaluation, and the importance of remaining in the ED until their evaluation is complete.  Workup initiated   Anselmo Pickler, [place] 10/05/21 2040

## 2021-10-05 NOTE — ED Triage Notes (Signed)
Pt reported to ED with c/o of pain left shoulder x3 days. States onset of symptoms began after working out in the gym. Pt able to move extremity freely.

## 2021-10-06 MED ORDER — LIDOCAINE 5 % EX PTCH: 1.0000 | MEDICATED_PATCH | CUTANEOUS | 0 refills | Status: DC

## 2021-10-06 MED ORDER — CYCLOBENZAPRINE HCL 10 MG PO TABS: 10.0000 mg | ORAL_TABLET | Freq: Two times a day (BID) | ORAL | 0 refills | Status: DC | PRN

## 2021-10-06 MED ORDER — KETOROLAC TROMETHAMINE 60 MG/2ML IM SOLN
30.0000 mg | Freq: Once | INTRAMUSCULAR | Status: AC
Start: 2021-10-06 — End: 2021-10-06
  Administered 2021-10-06: 30 mg via INTRAMUSCULAR
  Filled 2021-10-06: qty 2

## 2021-10-06 MED ORDER — NAPROXEN 500 MG PO TABS
500.0000 mg | ORAL_TABLET | Freq: Two times a day (BID) | ORAL | 0 refills | Status: DC
Start: 2021-10-06 — End: 2021-11-09

## 2021-10-06 NOTE — Discharge Instructions (Addendum)
Your x-ray revealed no evidence of fracture or dislocation.  We will prescribe high-dose anti-inflammatories, lidocaine patches, Flexeril for any muscle spasm.  Recommend continued rest, intermittent ice and heating, NSAIDs for pain control and follow-up with sports medicine if symptoms do not improve over the next week. Try shoulder range of motion exercises as tolerated

## 2021-10-06 NOTE — ED Notes (Signed)
MD in to see pt, at [facility]. Pt alert, NAD, calm, interactive.

## 2021-10-06 NOTE — ED Provider Notes (Addendum)
[facility] EMERGENCY DEPARTMENT Provider Note   CSN: [id number] Arrival date & time: 10/05/21  1928     History  Chief Complaint  Patient presents with   Shoulder Pain    Mitchell Steele is a 26 y.o. male.   Shoulder Pain   26 year old male presenting to the emerged department with left shoulder pain for the past 3 days.  He states that symptoms came on after working out at the gym.  He thinks that he was doing too many repetitive motions.  The patient is able to range his left shoulder.  He denies any fall or clear injury to the joint.  He states that he has had some pain with range of motion of the shoulder.  He denies any numbness or weakness in the left upper extremity.  He states that he has been applying ice and heat without significant relief.  Home Medications Prior to Admission medications   Medication Sig Start Date End Date Taking? Authorizing Provider  cyclobenzaprine (FLEXERIL) 10 MG tablet Take 1 tablet (10 mg total) by mouth 2 (two) times daily as needed for muscle spasms. 10/06/21  Yes Regan Lemming, MD  lidocaine (LIDODERM) 5 % Place 1 patch onto the skin daily. Remove & Discard patch within 12 hours or as directed by MD 10/06/21  Yes Regan Lemming, MD  naproxen (NAPROSYN) 500 MG tablet Take 1 tablet (500 mg total) by mouth 2 (two) times daily. 10/06/21  Yes Regan Lemming, MD  lisinopril (ZESTRIL) 10 MG tablet Take 1 tablet (10 mg total) by mouth daily. 09/03/21 12/02/21  Fenton Foy, NP  metFORMIN (GLUCOPHAGE) 500 MG tablet Take 1 tablet (500 mg total) by mouth 2 (two) times daily with a meal. 09/03/21 09/03/22  Fenton Foy, NP  simvastatin (ZOCOR) 10 MG tablet TAKE 1 TABLET BY MOUTH AT BEDTIME 08/21/21   Fenton Foy, NP  simvastatin (ZOCOR) 5 MG tablet TAKE 1 TABLET BY MOUTH AT BEDTIME 08/21/21   Fenton Foy, NP  triamcinolone cream (KENALOG) 0.1 % Apply 1 application topically 2 (two) times daily. 01/04/20   Vevelyn Francois, NP       Allergies    Patient has no known allergies.    Review of Systems   Review of Systems  All other systems reviewed and are negative.   Physical Exam Updated Vital Signs BP 123/61   Pulse 79   Temp 98.5 F (36.9 C) (Oral)   Resp 16   SpO2 99%  Physical Exam Vitals and nursing note reviewed.  Constitutional:      General: He is not in acute distress.    Appearance: He is well-developed.  HENT:     Head: Normocephalic and atraumatic.  Eyes:     Conjunctiva/sclera: Conjunctivae normal.  Cardiovascular:     Rate and Rhythm: Normal rate and regular rhythm.  Pulmonary:     Effort: Pulmonary effort is normal. No respiratory distress.     Breath sounds: Normal breath sounds.  Abdominal:     Palpations: Abdomen is soft.     Tenderness: There is no abdominal tenderness.  Musculoskeletal:        General: Tenderness present. No swelling or deformity.     Cervical back: Neck supple.     Comments: Tenderness to palpation about the biceps tendon, negative empty can test, 2+ distal pulses, intact motor function along the median, ulnar, radial nerve distributions, no AC joint tenderness, no clavicular tenderness, passive range of motion with minimal  discomfort.  Skin:    General: Skin is warm and dry.     Capillary Refill: Capillary refill takes less than 2 seconds.  Neurological:     Mental Status: He is alert.  Psychiatric:        Mood and Affect: Mood normal.     ED Results / Procedures / Treatments   Labs (all labs ordered are listed, but only abnormal results are displayed) Labs Reviewed - No data to display  EKG None  Radiology DG Shoulder Left  Result Date: 10/05/2021 CLINICAL DATA:  Left shoulder pain. EXAM: LEFT SHOULDER - 2+ VIEW COMPARISON:  None Available. FINDINGS: There is no evidence of fracture or dislocation. There is no evidence of arthropathy or other focal bone abnormality. Soft tissues are unremarkable. IMPRESSION: Negative. Electronically Signed   By:  Virgina Norfolk M.D.   On: 10/05/2021 21:19    Procedures Procedures    Medications Ordered in ED Medications  ketorolac (TORADOL) injection 30 mg (has no administration in time range)    ED Course/ Medical Decision Making/ A&P                           Medical Decision Making Risk Prescription drug management.    26 year old male presenting to the emerged department with left shoulder pain for the past 3 days.  He states that symptoms came on after working out at the gym.  He thinks that he was doing too many repetitive motions.  The patient is able to range his left shoulder.  He denies any fall or clear injury to the joint.  He states that he has had some pain with range of motion of the shoulder.  He denies any numbness or weakness in the left upper extremity.  He states that he has been applying ice and heat without significant relief.  On arrival, the patient was vitally stable.  X-ray imaging was performed and was negative for acute fracture or dislocation.  The patient has no tenderness to palpation about the [facility] joint and has no clavicular tenderness.  He does have tenderness about the biceps tendon which could reflect a biceps tendinitis.  Additionally considered ligamentous injuries such as torn labrum.  The patient is able to passively range the shoulder and has no concerning findings on exam for active impingement.  He is neurovascularly intact in the left upper extremity.  Advised rest, ice, elevation of the extremity, NSAIDs for pain control.  Will prescribe also Flexeril and lidocaine patches and have the patient follow-up with sports medicine.   Final Clinical Impression(s) / ED Diagnoses Final diagnoses:  Acute pain of left shoulder    Rx / DC Orders ED Discharge Orders          Ordered    AMB referral to sports medicine        10/06/21 0826    cyclobenzaprine (FLEXERIL) 10 MG tablet  2 times daily PRN        10/06/21 0832    naproxen (NAPROSYN) 500 MG tablet   2 times daily        10/06/21 0832    lidocaine (LIDODERM) 5 %  Every 24 hours        10/06/21 0832              Regan Lemming, MD 10/06/21 0830    Regan Lemming, MD 10/06/21 [phone]

## 2021-10-09 ENCOUNTER — Ambulatory Visit: Payer: Self-pay | Admitting: Nurse Practitioner

## 2021-10-21 ENCOUNTER — Encounter: Payer: Self-pay | Admitting: Skilled Nursing Facility1

## 2021-10-21 ENCOUNTER — Encounter: Payer: Medicaid Other | Attending: Nurse Practitioner | Admitting: Skilled Nursing Facility1

## 2021-10-21 DIAGNOSIS — E669 Obesity, unspecified: Secondary | ICD-10-CM

## 2021-10-21 DIAGNOSIS — Z713 Dietary counseling and surveillance: Secondary | ICD-10-CM | POA: Insufficient documentation

## 2021-10-21 DIAGNOSIS — Z6841 Body Mass Index (BMI) 40.0 and over, adult: Secondary | ICD-10-CM | POA: Insufficient documentation

## 2021-10-21 DIAGNOSIS — E119 Type 2 diabetes mellitus without complications: Secondary | ICD-10-CM

## 2021-10-21 NOTE — Progress Notes (Signed)
Medical Nutrition Therapy   Primary concerns today: weight loss  Referral diagnosis: e66.01 Preferred learning style: no preference indicated Learning readiness: change in progress   NUTRITION ASSESSMENT   Clinical Medical Hx: DM, HTN, dyslipidemia  Medications: see list Labs: A1C at one point 7.1 and now 6.0  Notable Signs/Symptoms: N/A  Lifestyle & Dietary Hx  Pt states he has had diabetes for about 1 year.  Pt states he checks his blood sugars daily or every other day post prandial about 2.5 hours: reporting 82, sometimes 100.   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    Pt states he lives with his mother who is on dialysis  Pt states he has been losing weight about 29 pounds since May stating he changed his eating goes to the gym every day.  Pt states he works from midnight to 8pm and goes to sleep at 4pm.   Pt states his mom is supportive and is also making changes with him.   Estimated daily fluid intake: 55 oz Supplements: N/A Sleep: getting about 6.5-7 hours daily wakes sometimes not feeling rested but mostly okay Stress / self-care: no complaints  Current average weekly physical activity: gym 3-4 days a week: weights and 30-40 minutes cardio treadmill at [employer] and 7 or 8 incline  24-Hr Dietary Recall: wakes 10:30pm  First Meal: 12am-1am: fresh fruit 6 berries and greek yogurt Snack:  Second Meal: 5:30-6 am: chicken and broccoli + butter and fresh strawberries Snack 10am: greek yogurt and fruit Third Meal 3pm: chicken or fish or lean ground beef + green beans and fruit Snack:  Beverages: water, sparkling water   Estimated Energy Needs Calories: 2000    NUTRITION INTERVENTION  Nutrition education (E-1) on the following topics:  Diabetes and risk of heart disease Saturated fat verses unsaturated fat  Creation of balanced meals Grains and B vitamins   Handouts Provided Include  Detailed  MyPlate  Learning Style & Readiness for Change Teaching method utilized: Visual & Auditory  Demonstrated degree of understanding via: Teach Back  Barriers to learning/adherence to lifestyle change: none identified   Goals Established by Pt Include nuts and whole grains in appropriate portions Reduce your protein size to your palm  Drink 4 water bottles a day   MONITORING & EVALUATION Dietary intake, weekly physical activity  Next Steps  Patient is to call or email with any questions or concerns.

## 2021-11-09 ENCOUNTER — Encounter: Payer: Self-pay | Admitting: Nurse Practitioner

## 2021-11-09 ENCOUNTER — Ambulatory Visit (INDEPENDENT_AMBULATORY_CARE_PROVIDER_SITE_OTHER): Payer: Self-pay | Admitting: Nurse Practitioner

## 2021-11-09 VITALS — BP 138/71 | HR 74 | Temp 97.2°F | Ht 65.0 in | Wt 260.8 lb

## 2021-11-09 DIAGNOSIS — M25512 Pain in left shoulder: Secondary | ICD-10-CM

## 2021-11-09 DIAGNOSIS — Z0000 Encounter for general adult medical examination without abnormal findings: Secondary | ICD-10-CM

## 2021-11-09 DIAGNOSIS — E119 Type 2 diabetes mellitus without complications: Secondary | ICD-10-CM

## 2021-11-09 LAB — POCT GLYCOSYLATED HEMOGLOBIN (HGB A1C)
HbA1c POC (<> result, manual entry): 5 % (ref 4.0–5.6)
HbA1c, POC (controlled diabetic range): 5 % (ref 0.0–7.0)
HbA1c, POC (prediabetic range): 5 % — AB (ref 5.7–6.4)
Hemoglobin A1C: 5 % (ref 4.0–5.6)

## 2021-11-09 MED ORDER — SIMVASTATIN 5 MG PO TABS: 5.0000 mg | ORAL_TABLET | Freq: Every day | ORAL | 0 refills | Status: DC

## 2021-11-09 MED ORDER — CYCLOBENZAPRINE HCL 10 MG PO TABS: 10.0000 mg | ORAL_TABLET | Freq: Two times a day (BID) | ORAL | 0 refills | Status: DC | PRN

## 2021-11-09 MED ORDER — NAPROXEN 500 MG PO TABS: 500.0000 mg | ORAL_TABLET | Freq: Two times a day (BID) | ORAL | 0 refills | Status: DC

## 2021-11-09 NOTE — Progress Notes (Signed)
$'@Patient'p$  ID: Mitchell Steele, male    DOB: 05/30/1995, 26 y.o.   MRN: [id number]  Chief Complaint  Patient presents with   Diabetes    Pt is here for 3 month's DM follow up visit/ Pt states LT shoulder still hurts pt states it has been hurting for  1 month. Pt is requesting refill on FLEXERIL, naproxen,simvastatin    Referring provider: Fenton Foy, NP   HPI  Mitchell Steele presents for follow up. He  has a past medical history of Eczema, Migraine, and Obesity.   Patient presents today or a follow up on diabetes. Patient states that he has been having left shoulder pain after injury at the gym. Would like flexeril and naproxen reordered. Patient does have full ROM to left shoulder and arm. Denies f/c/s, n/v/d, hemoptysis, PND, leg swelling Denies chest pain or edema       No Known Allergies  Immunization History  Administered Date(s) Administered   HPV Quadrivalent 04/03/2012   Influenza, Seasonal, Injecte, Preservative Fre 04/03/2012    Past Medical History:  Diagnosis Date   Cancer ([place])    Diabetes mellitus without complication ([place])    Eczema    Migraine    Obesity     Tobacco History: Social History   Tobacco Use  Smoking Status Former   Packs/day: 0.00   Types: Cigars, Cigarettes   Quit date: 08/09/2021   Years since quitting: 0.2  Smokeless Tobacco Never   Counseling given: Not Answered   Outpatient Encounter Medications as of 11/09/2021  Medication Sig   lisinopril (ZESTRIL) 10 MG tablet Take 1 tablet (10 mg total) by mouth daily.   metFORMIN (GLUCOPHAGE) 500 MG tablet Take 1 tablet (500 mg total) by mouth 2 (two) times daily with a meal.   simvastatin (ZOCOR) 10 MG tablet TAKE 1 TABLET BY MOUTH AT BEDTIME   triamcinolone cream (KENALOG) 0.1 % Apply 1 application topically 2 (two) times daily.   [DISCONTINUED] cyclobenzaprine (FLEXERIL) 10 MG tablet Take 1 tablet (10 mg total) by mouth 2 (two) times daily as needed for muscle spasms.    [DISCONTINUED] naproxen (NAPROSYN) 500 MG tablet Take 1 tablet (500 mg total) by mouth 2 (two) times daily.   cyclobenzaprine (FLEXERIL) 10 MG tablet Take 1 tablet (10 mg total) by mouth 2 (two) times daily as needed for muscle spasms.   lidocaine (LIDODERM) 5 % Place 1 patch onto the skin daily. Remove & Discard patch within 12 hours or as directed by MD (Patient not taking: Reported on 11/09/2021)   naproxen (NAPROSYN) 500 MG tablet Take 1 tablet (500 mg total) by mouth 2 (two) times daily.   simvastatin (ZOCOR) 5 MG tablet Take 1 tablet (5 mg total) by mouth at bedtime.   [DISCONTINUED] simvastatin (ZOCOR) 5 MG tablet TAKE 1 TABLET BY MOUTH AT BEDTIME (Patient not taking: Reported on 11/09/2021)   No facility-administered encounter medications on file as of 11/09/2021.     Review of Systems  Review of Systems  Constitutional: Negative.   HENT: Negative.    Cardiovascular: Negative.   Gastrointestinal: Negative.   Musculoskeletal:        Left shoulder pain  Allergic/Immunologic: Negative.   Neurological: Negative.   Psychiatric/Behavioral: Negative.         Physical Exam  BP 138/71 (BP Location: Right Arm, Patient Position: Sitting, Cuff Size: Large)   Pulse 74   Temp (!) 97.2 F (36.2 C)   Ht '5\' 5"'$  (1.651 m)   Wt 260 lb  12.8 oz (118.3 kg)   SpO2 100%   BMI 43.40 kg/m   Wt Readings from Last 5 Encounters:  11/09/21 260 lb 12.8 oz (118.3 kg)  10/21/21 267 lb 12.8 oz (121.5 kg)  08/17/21 296 lb 8 oz (134.5 kg)  07/21/20 297 lb (134.7 kg)  02/18/20 278 lb 6.4 oz (126.3 kg)     Physical Exam Vitals and nursing note reviewed.  Constitutional:      General: He is not in acute distress.    Appearance: He is well-developed.  Cardiovascular:     Rate and Rhythm: Normal rate and regular rhythm.  Pulmonary:     Effort: Pulmonary effort is normal.     Breath sounds: Normal breath sounds.  Skin:    General: Skin is warm and dry.  Neurological:     Mental Status: He is  alert and oriented to person, place, and time.       Assessment & Plan:   Type 2 diabetes mellitus without complication, without long-term current use of insulin (HCC) - POCT glycosylated hemoglobin (Hb A1C) - simvastatin (ZOCOR) 5 MG tablet; Take 1 tablet (5 mg total) by mouth at bedtime.  Dispense: 90 tablet; Refill: 0 - Lipid Panel - CBC - Comprehensive metabolic panel - TSH  2. Acute pain of left shoulder  - cyclobenzaprine (FLEXERIL) 10 MG tablet; Take 1 tablet (10 mg total) by mouth 2 (two) times daily as needed for muscle spasms.  Dispense: 20 tablet; Refill: 0 - naproxen (NAPROSYN) 500 MG tablet; Take 1 tablet (500 mg total) by mouth 2 (two) times daily.  Dispense: 30 tablet; Refill: 0  3. Routine health maintenance  - Lipid Panel - CBC - Comprehensive metabolic panel - TSH   Follow up:  Follow up in 3 months or sooner if needed     Fenton Foy, NP 11/09/2021

## 2021-11-09 NOTE — Assessment & Plan Note (Signed)
-   POCT glycosylated hemoglobin (Hb A1C) - simvastatin (ZOCOR) 5 MG tablet; Take 1 tablet (5 mg total) by mouth at bedtime.  Dispense: 90 tablet; Refill: 0 - Lipid Panel - CBC - Comprehensive metabolic panel - TSH  2. Acute pain of left shoulder  - cyclobenzaprine (FLEXERIL) 10 MG tablet; Take 1 tablet (10 mg total) by mouth 2 (two) times daily as needed for muscle spasms.  Dispense: 20 tablet; Refill: 0 - naproxen (NAPROSYN) 500 MG tablet; Take 1 tablet (500 mg total) by mouth 2 (two) times daily.  Dispense: 30 tablet; Refill: 0  3. Routine health maintenance  - Lipid Panel - CBC - Comprehensive metabolic panel - TSH   Follow up:  Follow up in 3 months or sooner if needed

## 2021-11-09 NOTE — Patient Instructions (Signed)
1. Type 2 diabetes mellitus without complication, without long-term current use of insulin (HCC)  - POCT glycosylated hemoglobin (Hb A1C) - simvastatin (ZOCOR) 5 MG tablet; Take 1 tablet (5 mg total) by mouth at bedtime.  Dispense: 90 tablet; Refill: 0 - Lipid Panel - CBC - Comprehensive metabolic panel - TSH  2. Acute pain of left shoulder  - cyclobenzaprine (FLEXERIL) 10 MG tablet; Take 1 tablet (10 mg total) by mouth 2 (two) times daily as needed for muscle spasms.  Dispense: 20 tablet; Refill: 0 - naproxen (NAPROSYN) 500 MG tablet; Take 1 tablet (500 mg total) by mouth 2 (two) times daily.  Dispense: 30 tablet; Refill: 0  3. Routine health maintenance  - Lipid Panel - CBC - Comprehensive metabolic panel - TSH   Follow up:  Follow up in 3 months or sooner if needed

## 2021-11-10 LAB — LIPID PANEL
Chol/HDL Ratio: 4.4 ratio (ref 0.0–5.0)
Cholesterol, Total: 137 mg/dL (ref 100–199)
HDL: 31 mg/dL — ABNORMAL LOW (ref >39)
LDL Chol Calc (NIH): 83 mg/dL (ref 0–99)
Triglycerides: 130 mg/dL (ref 0–149)
VLDL Cholesterol Cal: 23 mg/dL (ref 5–40)

## 2021-11-10 LAB — CBC
Hematocrit: 41.4 % (ref 37.5–51.0)
Hemoglobin: 13.8 g/dL (ref 13.0–17.7)
MCH: 26.3 pg — ABNORMAL LOW (ref 26.6–33.0)
MCHC: 33.3 g/dL (ref 31.5–35.7)
MCV: 79 fL (ref 79–97)
Platelets: 302 10*3/uL (ref 150–450)
RBC: 5.24 x10E6/uL (ref 4.14–5.80)
RDW: 14.6 % (ref 11.6–15.4)
WBC: 11.9 10*3/uL — ABNORMAL HIGH (ref 3.4–10.8)

## 2021-11-10 LAB — COMPREHENSIVE METABOLIC PANEL
ALT: 16 IU/L (ref 0–44)
AST: 10 IU/L (ref 0–40)
Albumin/Globulin Ratio: 1.6 (ref 1.2–2.2)
Albumin: 4.6 g/dL (ref 4.3–5.2)
Alkaline Phosphatase: 87 IU/L (ref 44–121)
BUN/Creatinine Ratio: 16 (ref 9–20)
BUN: 17 mg/dL (ref 6–20)
Bilirubin Total: 0.3 mg/dL (ref 0.0–1.2)
CO2: 23 mmol/L (ref 20–29)
Calcium: 9.8 mg/dL (ref 8.7–10.2)
Chloride: 101 mmol/L (ref 96–106)
Creatinine, Ser: 1.04 mg/dL (ref 0.76–1.27)
Globulin, Total: 2.8 g/dL (ref 1.5–4.5)
Glucose: 86 mg/dL (ref 70–99)
Potassium: 4.3 mmol/L (ref 3.5–5.2)
Sodium: 139 mmol/L (ref 134–144)
Total Protein: 7.4 g/dL (ref 6.0–8.5)
eGFR: 102 mL/min/{1.73_m2} (ref >59)

## 2021-11-10 LAB — TSH: TSH: 0.352 u[IU]/mL — ABNORMAL LOW (ref 0.450–4.500)

## 2021-11-12 ENCOUNTER — Telehealth: Payer: Self-pay | Admitting: Nurse Practitioner

## 2021-11-12 NOTE — Telephone Encounter (Signed)
Patient LVM stating he was returning a phone call from this office on Wednesday, 11/11/2021

## 2021-11-18 ENCOUNTER — Ambulatory Visit: Payer: Self-pay | Admitting: Nurse Practitioner

## 2022-01-07 ENCOUNTER — Other Ambulatory Visit: Payer: Self-pay | Admitting: Nurse Practitioner

## 2022-01-07 DIAGNOSIS — M25512 Pain in left shoulder: Secondary | ICD-10-CM

## 2022-01-07 MED ORDER — CYCLOBENZAPRINE HCL 10 MG PO TABS: 10.0000 mg | ORAL_TABLET | Freq: Two times a day (BID) | ORAL | 0 refills | Status: DC | PRN

## 2022-02-10 ENCOUNTER — Encounter: Payer: Self-pay | Admitting: Nurse Practitioner

## 2022-02-10 ENCOUNTER — Ambulatory Visit (INDEPENDENT_AMBULATORY_CARE_PROVIDER_SITE_OTHER): Payer: Self-pay | Admitting: Nurse Practitioner

## 2022-02-10 VITALS — BP 125/74 | HR 70 | Temp 97.6°F | Ht 65.0 in | Wt 236.0 lb

## 2022-02-10 DIAGNOSIS — E119 Type 2 diabetes mellitus without complications: Secondary | ICD-10-CM

## 2022-02-10 DIAGNOSIS — R7989 Other specified abnormal findings of blood chemistry: Secondary | ICD-10-CM

## 2022-02-10 LAB — POCT GLYCOSYLATED HEMOGLOBIN (HGB A1C): Hemoglobin A1C: 4.8 % (ref 4.0–5.6)

## 2022-02-10 MED ORDER — METFORMIN HCL ER 500 MG PO TB24: 500.0000 mg | ORAL_TABLET | Freq: Every day | ORAL | 2 refills | Status: DC

## 2022-02-10 NOTE — Progress Notes (Signed)
$'@Patient'Y$  ID: Mitchell Steele, male    DOB: 01-29-1996, 26 y.o.   MRN: [id number]  Chief Complaint  Patient presents with   Follow-up    DIABETES    Referring provider: Fenton Foy, NP   HPI  Mitchell Steele presents for follow up. He  has a past medical history of Eczema, Migraine, diabetes, and Obesity.    Patient presents today for a follow-up on diabetes and thyroid.  At last check his thyroid level was slightly low.  We will recheck this today.  He states that his blood sugars have been running around 80 at home.  We will switch his metformin to XL once daily 500 mg.  Currently taking metformin twice daily at 500 mg. Denies f/c/s, n/v/d, hemoptysis, PND, leg swelling Denies chest pain or edema     No Known Allergies  Immunization History  Administered Date(s) Administered   HPV Quadrivalent 04/03/2012   Influenza, Seasonal, Injecte, Preservative Fre 04/03/2012    Past Medical History:  Diagnosis Date   Cancer ([place])    Diabetes mellitus without complication ([place])    Eczema    Migraine    Obesity     Tobacco History: Social History   Tobacco Use  Smoking Status Former   Packs/day: 0.00   Types: Cigars, Cigarettes   Quit date: 08/09/2021   Years since quitting: 0.5  Smokeless Tobacco Never   Counseling given: Not Answered   Outpatient Encounter Medications as of 02/10/2022  Medication Sig   cyclobenzaprine (FLEXERIL) 10 MG tablet Take 1 tablet (10 mg total) by mouth 2 (two) times daily as needed for muscle spasms.   lidocaine (LIDODERM) 5 % Place 1 patch onto the skin daily. Remove & Discard patch within 12 hours or as directed by MD   metFORMIN (GLUCOPHAGE-XR) 500 MG 24 hr tablet Take 1 tablet (500 mg total) by mouth daily with breakfast.   naproxen (NAPROSYN) 500 MG tablet Take 1 tablet (500 mg total) by mouth 2 (two) times daily.   triamcinolone cream (KENALOG) 0.1 % Apply 1 application topically 2 (two) times daily.   [DISCONTINUED] metFORMIN  (GLUCOPHAGE) 500 MG tablet Take 1 tablet (500 mg total) by mouth 2 (two) times daily with a meal.   [DISCONTINUED] simvastatin (ZOCOR) 10 MG tablet TAKE 1 TABLET BY MOUTH AT BEDTIME   lisinopril (ZESTRIL) 10 MG tablet Take 1 tablet (10 mg total) by mouth daily.   [DISCONTINUED] simvastatin (ZOCOR) 5 MG tablet Take 1 tablet (5 mg total) by mouth at bedtime.   No facility-administered encounter medications on file as of 02/10/2022.     Review of Systems  Review of Systems  Constitutional: Negative.   HENT: Negative.    Cardiovascular: Negative.   Gastrointestinal: Negative.   Allergic/Immunologic: Negative.   Neurological: Negative.   Psychiatric/Behavioral: Negative.         Physical Exam  BP 125/74   Pulse 70   Temp 97.6 F (36.4 C)   Ht '5\' 5"'$  (1.651 m)   Wt 236 lb (107 kg)   SpO2 99%   BMI 39.27 kg/m   Wt Readings from Last 5 Encounters:  02/10/22 236 lb (107 kg)  11/09/21 260 lb 12.8 oz (118.3 kg)  10/21/21 267 lb 12.8 oz (121.5 kg)  08/17/21 296 lb 8 oz (134.5 kg)  07/21/20 297 lb (134.7 kg)     Physical Exam Vitals and nursing note reviewed.  Constitutional:      General: He is not in acute distress.  Appearance: He is well-developed.  Cardiovascular:     Rate and Rhythm: Normal rate and regular rhythm.  Pulmonary:     Effort: Pulmonary effort is normal.     Breath sounds: Normal breath sounds.  Skin:    General: Skin is warm and dry.  Neurological:     Mental Status: He is alert and oriented to person, place, and time.      Lab Results:  CBC    Component Value Date/Time   WBC 11.9 (H) 11/09/2021 1556   WBC 11.6 (H) 12/16/2019 0431   RBC 5.24 11/09/2021 1556   RBC 5.48 12/16/2019 0431   HGB 13.8 11/09/2021 1556   HCT 41.4 11/09/2021 1556   PLT 302 11/09/2021 1556   MCV 79 11/09/2021 1556   MCH 26.3 (L) 11/09/2021 1556   MCH 25.9 (L) 12/16/2019 0431   MCHC 33.3 11/09/2021 1556   MCHC 33.1 12/16/2019 0431   RDW 14.6 11/09/2021 1556    LYMPHSABS 2.3 12/16/2019 0431   MONOABS 0.9 12/16/2019 0431   EOSABS 0.0 12/16/2019 0431   BASOSABS 0.1 12/16/2019 0431    BMET    Component Value Date/Time   NA 139 11/09/2021 1556   K 4.3 11/09/2021 1556   CL 101 11/09/2021 1556   CO2 23 11/09/2021 1556   GLUCOSE 86 11/09/2021 1556   GLUCOSE 111 (H) 12/16/2019 0431   BUN 17 11/09/2021 1556   CREATININE 1.04 11/09/2021 1556   CALCIUM 9.8 11/09/2021 1556   GFRNONAA 123 12/27/2019 1511   GFRAA 142 12/27/2019 1511     Assessment & Plan:   Type 2 diabetes mellitus without complication, without long-term current use of insulin (HCC) - POCT glycosylated hemoglobin (Hb A1C) - metFORMIN (GLUCOPHAGE-XR) 500 MG 24 hr tablet; Take 1 tablet (500 mg total) by mouth daily with breakfast.  Dispense: 30 tablet; Refill: 2   2. Decreased thyroid stimulating hormone (TSH) level  - Thyroid Panel With TSH   Follow up:  Follow up in 3 months or sooner if needed     Fenton Foy, NP 02/10/2022

## 2022-02-10 NOTE — Patient Instructions (Addendum)
1. Type 2 diabetes mellitus without complication, without long-term current use of insulin (HCC)  - POCT glycosylated hemoglobin (Hb A1C) - metFORMIN (GLUCOPHAGE-XR) 500 MG 24 hr tablet; Take 1 tablet (500 mg total) by mouth daily with breakfast.  Dispense: 30 tablet; Refill: 2   2. Decreased thyroid stimulating hormone (TSH) level  - Thyroid Panel With TSH   Follow up:  Follow up in 3 months or sooner if needed

## 2022-02-10 NOTE — Assessment & Plan Note (Signed)
-   POCT glycosylated hemoglobin (Hb A1C) - metFORMIN (GLUCOPHAGE-XR) 500 MG 24 hr tablet; Take 1 tablet (500 mg total) by mouth daily with breakfast.  Dispense: 30 tablet; Refill: 2   2. Decreased thyroid stimulating hormone (TSH) level  - Thyroid Panel With TSH   Follow up:  Follow up in 3 months or sooner if needed

## 2022-02-11 LAB — THYROID PANEL WITH TSH
Free Thyroxine Index: 2.2 (ref 1.2–4.9)
T3 Uptake Ratio: 27 % (ref 24–39)
T4, Total: 8 ug/dL (ref 4.5–12.0)
TSH: 0.517 u[IU]/mL (ref 0.450–4.500)

## 2022-04-09 ENCOUNTER — Encounter: Payer: Self-pay | Admitting: Nurse Practitioner

## 2022-04-09 ENCOUNTER — Ambulatory Visit (INDEPENDENT_AMBULATORY_CARE_PROVIDER_SITE_OTHER): Payer: Self-pay | Admitting: Nurse Practitioner

## 2022-04-09 VITALS — BP 120/59 | HR 63 | Temp 97.8°F | Wt 226.2 lb

## 2022-04-09 DIAGNOSIS — E119 Type 2 diabetes mellitus without complications: Secondary | ICD-10-CM

## 2022-04-09 DIAGNOSIS — I152 Hypertension secondary to endocrine disorders: Secondary | ICD-10-CM

## 2022-04-09 DIAGNOSIS — H6123 Impacted cerumen, bilateral: Secondary | ICD-10-CM

## 2022-04-09 DIAGNOSIS — H938X1 Other specified disorders of right ear: Secondary | ICD-10-CM | POA: Insufficient documentation

## 2022-04-09 DIAGNOSIS — K5900 Constipation, unspecified: Secondary | ICD-10-CM

## 2022-04-09 MED ORDER — LISINOPRIL 10 MG PO TABS: 10.0000 mg | ORAL_TABLET | Freq: Every day | ORAL | 0 refills | Status: DC

## 2022-04-09 MED ORDER — POLYETHYLENE GLYCOL 3350 17 G PO PACK: 17.0000 g | PACK | Freq: Every day | ORAL | 0 refills | Status: DC | PRN

## 2022-04-09 NOTE — Assessment & Plan Note (Signed)
Bilateral ears were flushed in the office today without difficulty Bilateral tympanic membranes appear normal on examination.  Patient reports feeling of right ear fullness has improved after flushing

## 2022-04-09 NOTE — Assessment & Plan Note (Signed)
  For constipation it is important that you have an adequate intake of fruit and vegetables daily, at least 3 servings of each, as well as water intake of at least 48 ounces daily and regular exercise.  OTC stool softeners are helpful for daily use, up to 4 daily (eg. Colace)  Fiber intake daily is needed, in the form of Bran or Shredded Wheat   - polyethylene glycol (MIRALAX) 17 g packet; Take 17 g by mouth daily as needed.  Dispense: 14 each; Refill: 0

## 2022-04-09 NOTE — Assessment & Plan Note (Signed)
Currently well-controlled on metformin 500 mg daily Patient referred to ophthalmologist for diabetic eye exam Diabetic foot exam completed Urine creatinine albumin labs at next visit Lab Results  Component Value Date   HGBA1C 5.2 02/10/2022

## 2022-04-09 NOTE — Patient Instructions (Addendum)
Please get your Flu vaccine and  TDAP vaccine at the pharmacy  For constipation it is important that you have an adequate intake of fruit and vegetables daily, at least 3 servings of each, as well as water intake of at least 48 ounces daily and regular exercise.  OTC stool softeners are helpful for daily use, up to 4 daily (eg. Colace)  Fiber intake daily is needed, in the form of Bran or Shredded Wheat    Constipation, unspecified constipation type  - polyethylene glycol (MIRALAX) 17 g packet; Take 17 g by mouth daily as needed.  Dispense: 14 each; Refill: 0   It is important that you exercise regularly at least 30 minutes 5 times a week as tolerated  Think about what you will eat, plan ahead. Choose " clean, green, fresh or frozen" over canned, processed or packaged foods which are more sugary, salty and fatty. 70 to 75% of food eaten should be vegetables and fruit. Three meals at set times with snacks allowed between meals, but they must be fruit or vegetables. Aim to eat over a 12 hour period , example 7 am to 7 pm, and STOP after  your last meal of the day. Drink water,generally about 64 ounces per day, no other drink is as healthy. Fruit juice is best enjoyed in a healthy way, by EATING the fruit.  Thanks for choosing [facility] we consider it a privelige to serve you.

## 2022-04-09 NOTE — Progress Notes (Signed)
Established Patient Office Visit  Subjective:  Patient ID: Mitchell Steele, male    DOB: 07-19-1995  Age: 27 y.o. MRN: [id number]  CC:  Chief Complaint  Patient presents with   Ear Fullness    Right ear    Constipation    HPI Mitchell Steele is a 27 y.o. male with past medical history of hypertension, type 2 diabetes, cerumen impaction constipation, obesity presents with complaints of right ear fullness and constipation.    Ear fullness .patient states that feelings of right ear fullness has been ongoing for the past 5 days.  He denies trouble hearing, ear pain, ear discharge.   Constipation.  Ongoing for about a week, stated that he took some MiraLAX for about 3 days.  Had a small bowel movement today that feels hard.  He denies nausea, vomiting, abdominal pain.   Due for flu vaccine and Tdap vaccine patient was encouraged to get the vaccines at his pharmacy.  Past Medical History:  Diagnosis Date   Cancer ([place])    Diabetes mellitus without complication ([place])    Eczema    Migraine    Obesity     History reviewed. No pertinent surgical history.  Family History  Problem Relation Age of Onset   Diabetes Mother    Diabetes Maternal Grandmother    Hypertension Maternal Grandmother    Heart attack Maternal Grandmother    Heart disease Maternal Grandmother    Alcohol abuse Maternal Uncle     Social History   Socioeconomic History   Marital status: Single    Spouse name: Not on file   Number of children: Not on file   Years of education: Not on file   Highest education level: Not on file  Occupational History   Occupation: Student    Comment: 10/11th grade at [employer] Use   Smoking status: Former    Packs/day: 0.00    Types: Cigars, Cigarettes    Quit date: 08/09/2021    Years since quitting: 0.6   Smokeless tobacco: Never  Vaping Use   Vaping Use: Never used  Substance and Sexual Activity   Alcohol use: No    Alcohol/week: 0.0 standard drinks of  alcohol   Drug use: No   Sexual activity: Not on file  Other Topics Concern   Not on file  Social History Narrative   Mother is [name]   Social Determinants of Health   Financial Resource Strain: Not on file  Food Insecurity: Not on file  Transportation Needs: Not on file  Physical Activity: Not on file  Stress: Not on file  Social Connections: Not on file  Intimate Partner Violence: Not on file    Outpatient Medications Prior to Visit  Medication Sig Dispense Refill   metFORMIN (GLUCOPHAGE-XR) 500 MG 24 hr tablet Take 1 tablet (500 mg total) by mouth daily with breakfast. 30 tablet 2   triamcinolone cream (KENALOG) 0.1 % Apply 1 application topically 2 (two) times daily. 453.6 g 0   lisinopril (ZESTRIL) 10 MG tablet Take 1 tablet (10 mg total) by mouth daily. 90 tablet 0   cyclobenzaprine (FLEXERIL) 10 MG tablet Take 1 tablet (10 mg total) by mouth 2 (two) times daily as needed for muscle spasms. (Patient not taking: Reported on 04/09/2022) 20 tablet 0   lidocaine (LIDODERM) 5 % Place 1 patch onto the skin daily. Remove & Discard patch within 12 hours or as directed by MD (Patient not taking: Reported on 04/09/2022) 30 patch 0  naproxen (NAPROSYN) 500 MG tablet Take 1 tablet (500 mg total) by mouth 2 (two) times daily. (Patient not taking: Reported on 04/09/2022) 30 tablet 0   No facility-administered medications prior to visit.    No Known Allergies  ROS Review of Systems  Constitutional:  Negative for activity change, appetite change, chills, diaphoresis, fatigue and unexpected weight change.  HENT:  Negative for congestion, dental problem, ear discharge, ear pain and tinnitus.   Respiratory: Negative.  Negative for apnea, cough, choking, chest tightness, shortness of breath and stridor.   Cardiovascular: Negative.  Negative for chest pain, palpitations and leg swelling.  Gastrointestinal:  Positive for constipation. Negative for abdominal distention, abdominal pain, anal  bleeding, blood in stool, diarrhea, nausea and rectal pain.  Musculoskeletal: Negative.   Neurological: Negative.   Psychiatric/Behavioral: Negative.  Negative for agitation, behavioral problems, confusion, decreased concentration and dysphoric mood.       Objective:    Physical Exam Constitutional:      General: He is not in acute distress.    Appearance: He is obese. He is not ill-appearing, toxic-appearing or diaphoretic.  HENT:     Right Ear: Tympanic membrane, ear canal and external ear normal. There is no impacted cerumen.     Left Ear: Tympanic membrane, ear canal and external ear normal. There is no impacted cerumen.     Nose: Nose normal. No congestion or rhinorrhea.  Eyes:     General: No scleral icterus.       Right eye: No discharge.        Left eye: No discharge.     Extraocular Movements: Extraocular movements intact.  Cardiovascular:     Rate and Rhythm: Normal rate and regular rhythm.     Pulses: Normal pulses.     Heart sounds: Normal heart sounds. No murmur heard.    No friction rub. No gallop.  Pulmonary:     Effort: Pulmonary effort is normal. No respiratory distress.     Breath sounds: Normal breath sounds. No stridor. No wheezing, rhonchi or rales.  Chest:     Chest wall: No tenderness.  Abdominal:     General: There is no distension.     Palpations: Abdomen is soft. There is no mass.     Tenderness: There is no abdominal tenderness. There is no right CVA tenderness, left CVA tenderness, guarding or rebound.     Hernia: No hernia is present.  Musculoskeletal:        General: No swelling, tenderness, deformity or signs of injury.     Right lower leg: No edema.     Left lower leg: No edema.  Skin:    General: Skin is warm and dry.  Neurological:     Mental Status: He is alert and oriented to person, place, and time.     Cranial Nerves: No cranial nerve deficit.     Sensory: No sensory deficit.     Motor: No weakness.     Coordination: Coordination  normal.     Gait: Gait normal.  Psychiatric:        Mood and Affect: Mood normal.        Behavior: Behavior normal.        Thought Content: Thought content normal.        Judgment: Judgment normal.     BP (!) 120/59   Pulse 63   Temp 97.8 F (36.6 C)   Wt 226 lb 3.2 oz (102.6 kg)   SpO2 99%  BMI 37.64 kg/m  Wt Readings from Last 3 Encounters:  04/09/22 226 lb 3.2 oz (102.6 kg)  02/10/22 236 lb (107 kg)  11/09/21 260 lb 12.8 oz (118.3 kg)    Lab Results  Component Value Date   TSH 0.517 02/10/2022   Lab Results  Component Value Date   WBC 11.9 (H) 11/09/2021   HGB 13.8 11/09/2021   HCT 41.4 11/09/2021   MCV 79 11/09/2021   PLT 302 11/09/2021   Lab Results  Component Value Date   NA 139 11/09/2021   K 4.3 11/09/2021   CO2 23 11/09/2021   GLUCOSE 86 11/09/2021   BUN 17 11/09/2021   CREATININE 1.04 11/09/2021   BILITOT 0.3 11/09/2021   ALKPHOS 87 11/09/2021   AST 10 11/09/2021   ALT 16 11/09/2021   PROT 7.4 11/09/2021   ALBUMIN 4.6 11/09/2021   CALCIUM 9.8 11/09/2021   ANIONGAP 8 12/16/2019   EGFR 102 11/09/2021   Lab Results  Component Value Date   CHOL 137 11/09/2021   Lab Results  Component Value Date   HDL 31 (L) 11/09/2021   Lab Results  Component Value Date   LDLCALC 83 11/09/2021   Lab Results  Component Value Date   TRIG 130 11/09/2021   Lab Results  Component Value Date   CHOLHDL 4.4 11/09/2021   Lab Results  Component Value Date   HGBA1C 5.2 02/10/2022      Assessment & Plan:   Problem List Items Addressed This Visit       Cardiovascular and Mediastinum   Hypertension due to endocrine disorder   Relevant Medications   lisinopril (ZESTRIL) 10 MG tablet     Endocrine   Type 2 diabetes mellitus without complication, without long-term current use of insulin (HCC)    Currently well-controlled on metformin 500 mg daily Patient referred to ophthalmologist for diabetic eye exam Diabetic foot exam completed Urine creatinine  albumin labs at next visit Lab Results  Component Value Date   HGBA1C 5.2 02/10/2022         Relevant Medications   lisinopril (ZESTRIL) 10 MG tablet   Other Relevant Orders   Ambulatory referral to Ophthalmology     Nervous and Auditory   Cerumen impaction - Primary    Bilateral ears were flushed in the office today without difficulty Bilateral tympanic membranes appear normal on examination.  Patient reports feeling of right ear fullness has improved after flushing         Other   Constipation     For constipation it is important that you have an adequate intake of fruit and vegetables daily, at least 3 servings of each, as well as water intake of at least 48 ounces daily and regular exercise.  OTC stool softeners are helpful for daily use, up to 4 daily (eg. Colace)  Fiber intake daily is needed, in the form of Bran or Shredded Wheat   - polyethylene glycol (MIRALAX) 17 g packet; Take 17 g by mouth daily as needed.  Dispense: 14 each; Refill: 0      Relevant Medications   polyethylene glycol (MIRALAX) 17 g packet    Meds ordered this encounter  Medications   polyethylene glycol (MIRALAX) 17 g packet    Sig: Take 17 g by mouth daily as needed.    Dispense:  14 each    Refill:  0   lisinopril (ZESTRIL) 10 MG tablet    Sig: Take 1 tablet (10 mg total) by mouth daily.  Dispense:  90 tablet    Refill:  0    Follow-up: No follow-ups on file.    Renee Rival, FNP

## 2022-04-09 NOTE — Assessment & Plan Note (Signed)
BP Readings from Last 3 Encounters:  04/09/22 (!) 120/59  02/10/22 125/74  11/09/21 138/71  Lisinopril 10 mg daily refilled DASH diet advised engage in regular moderate to vigorous exercises at least 150 minutes weekly

## 2022-05-13 ENCOUNTER — Ambulatory Visit (INDEPENDENT_AMBULATORY_CARE_PROVIDER_SITE_OTHER): Payer: Self-pay | Admitting: Nurse Practitioner

## 2022-05-13 ENCOUNTER — Encounter: Payer: Self-pay | Admitting: Nurse Practitioner

## 2022-05-13 VITALS — BP 130/80 | HR 69 | Temp 98.0°F | Ht 65.0 in | Wt 230.0 lb

## 2022-05-13 DIAGNOSIS — K5900 Constipation, unspecified: Secondary | ICD-10-CM

## 2022-05-13 DIAGNOSIS — E119 Type 2 diabetes mellitus without complications: Secondary | ICD-10-CM

## 2022-05-13 LAB — POCT GLYCOSYLATED HEMOGLOBIN (HGB A1C): Hemoglobin A1C: 4.8 % (ref 4.0–5.6)

## 2022-05-13 MED ORDER — METFORMIN HCL ER 500 MG PO TB24: 500.0000 mg | ORAL_TABLET | Freq: Every day | ORAL | 2 refills | Status: DC

## 2022-05-13 MED ORDER — DOCUSATE SODIUM 100 MG PO CAPS: 100.0000 mg | ORAL_CAPSULE | Freq: Two times a day (BID) | ORAL | 0 refills | Status: DC

## 2022-05-13 NOTE — Patient Instructions (Addendum)
1. Type 2 diabetes mellitus without complication, without long-term current use of insulin (HCC)  - POCT glycosylated hemoglobin (Hb A1C) - Microalbumin/Creatinine Ratio, Urine - Ambulatory referral to Ophthalmology - metFORMIN (GLUCOPHAGE-XR) 500 MG 24 hr tablet; Take 1 tablet (500 mg total) by mouth daily with breakfast.  Dispense: 30 tablet; Refill: 2  2. Constipation, unspecified constipation type  - docusate sodium (COLACE) 100 MG capsule; Take 1 capsule (100 mg total) by mouth 2 (two) times daily.  Dispense: 10 capsule; Refill: 0 - Ambulatory referral to Gastroenterology   Follow up:  Follow up in 6 months

## 2022-05-13 NOTE — Progress Notes (Signed)
Subjective:    Patient ID: Mitchell Steele, male    DOB: November 22, 1995, 27 y.o.   MRN: [id number]  Mitchell Steele is a 27 y.o. male who presents for follow-up of Type 2 diabetes mellitus.  Patient is checking home blood sugars.   Home blood sugar records: BGs range between 80 and 120 How often is blood sugars being checked: daily Current symptoms/problems include none and have been stable.  The following portions of the patient's history were reviewed and updated as appropriate: allergies, current medications, past medical history, past social history and problem list.  Review of Systems  Constitutional: Negative.   HENT: Negative.    Eyes: Negative.   Respiratory: Negative.    Cardiovascular: Negative.   Gastrointestinal: Negative.   Genitourinary: Negative.   Musculoskeletal: Negative.   Skin: Negative.   Neurological: Negative.   Endo/Heme/Allergies: Negative.   Psychiatric/Behavioral: Negative.          Objective:    Physical Exam Constitutional:      General: He is not in acute distress. Cardiovascular:     Rate and Rhythm: Normal rate and regular rhythm.  Pulmonary:     Effort: Pulmonary effort is normal.     Breath sounds: Normal breath sounds.  Skin:    General: Skin is warm and dry.  Neurological:     Mental Status: He is alert and oriented to person, place, and time.  Psychiatric:        Mood and Affect: Affect normal.      Blood pressure 130/80, pulse 69, temperature 98 F (36.7 C), height '5\' 5"'$  (1.651 m), weight 230 lb (104.3 kg), SpO2 99 %.  Lab Review    Latest Ref Rng & Units 02/10/2022   11:15 AM 11/09/2021    3:56 PM 11/09/2021    3:36 PM 07/21/2020   11:42 AM 07/21/2020   10:58 AM  Diabetic Labs  HbA1c 4.0 - 5.6 % 4.8  C  5.0    5.0    5.0    5.0   6.0    6.0    6.0    6.0   Chol 100 - 199 mg/dL  137   122    HDL >39 mg/dL  31   31    Calc LDL 0 - 99 mg/dL  83   76    Triglycerides 0 - 149 mg/dL  130   73    Creatinine 0.76 - 1.27 mg/dL   1.04   0.86      C Corrected result   Multiple values from one day are sorted in reverse-chronological order      05/13/2022   11:10 AM 05/13/2022   10:45 AM 04/09/2022   10:14 AM 02/10/2022   11:07 AM 11/09/2021    3:24 PM  BP/Weight  Systolic BP 376 283 151 761 607  Diastolic BP 80 44 59 74 71  Wt. (Lbs)  230 226.2 236 260.8  BMI  38.27 kg/m2 37.64 kg/m2 39.27 kg/m2 43.4 kg/m2      04/09/2022   10:00 AM  Foot/eye exam completion dates  Foot Form Completion Done    Mitchell Steele  reports that he quit smoking about 9 months ago. His smoking use included cigars and cigarettes. He has never used smokeless tobacco. He reports that he does not drink alcohol and does not use drugs.     Assessment & Plan:    Type 2 diabetes mellitus without complication, without long-term current use of insulin ([place]) - Plan: POCT  glycosylated hemoglobin (Hb A1C), Microalbumin/Creatinine Ratio, Urine, Ambulatory referral to Ophthalmology, metFORMIN (GLUCOPHAGE-XR) 500 MG 24 hr tablet, CBC, Comprehensive metabolic panel  Constipation, unspecified constipation type - Plan: docusate sodium (COLACE) 100 MG capsule, Ambulatory referral to Gastroenterology  Rx changes: none Education: Reviewed 'ABCs' of diabetes management (respective goals in parentheses):  A1C (<7), blood pressure (<130/80), and cholesterol (LDL <100). Compliance at present is estimated to be excellent. Efforts to improve compliance (if necessary) will be directed at increased exercise. Follow up: 3 months  Patient Instructions  1. Type 2 diabetes mellitus without complication, without long-term current use of insulin (HCC)  - POCT glycosylated hemoglobin (Hb A1C) - Microalbumin/Creatinine Ratio, Urine - Ambulatory referral to Ophthalmology - metFORMIN (GLUCOPHAGE-XR) 500 MG 24 hr tablet; Take 1 tablet (500 mg total) by mouth daily with breakfast.  Dispense: 30 tablet; Refill: 2  2. Constipation, unspecified constipation type  - docusate sodium  (COLACE) 100 MG capsule; Take 1 capsule (100 mg total) by mouth 2 (two) times daily.  Dispense: 10 capsule; Refill: 0 - Ambulatory referral to Gastroenterology   Follow up:  Follow up in 6 months   Lazaro Arms, FNP-C 05/13/22

## 2022-05-14 LAB — COMPREHENSIVE METABOLIC PANEL
ALT: 18 IU/L (ref 0–44)
AST: 15 IU/L (ref 0–40)
Albumin/Globulin Ratio: 2 (ref 1.2–2.2)
Albumin: 4.8 g/dL (ref 4.3–5.2)
Alkaline Phosphatase: 86 IU/L (ref 44–121)
BUN/Creatinine Ratio: 12 (ref 9–20)
BUN: 12 mg/dL (ref 6–20)
Bilirubin Total: 0.4 mg/dL (ref 0.0–1.2)
CO2: 25 mmol/L (ref 20–29)
Calcium: 9.9 mg/dL (ref 8.7–10.2)
Chloride: 100 mmol/L (ref 96–106)
Creatinine, Ser: 1 mg/dL (ref 0.76–1.27)
Globulin, Total: 2.4 g/dL (ref 1.5–4.5)
Glucose: 83 mg/dL (ref 70–99)
Potassium: 4.5 mmol/L (ref 3.5–5.2)
Sodium: 140 mmol/L (ref 134–144)
Total Protein: 7.2 g/dL (ref 6.0–8.5)
eGFR: 106 mL/min/{1.73_m2} (ref >59)

## 2022-05-14 LAB — CBC
Hematocrit: 42.9 % (ref 37.5–51.0)
Hemoglobin: 14.4 g/dL (ref 13.0–17.7)
MCH: 26.8 pg (ref 26.6–33.0)
MCHC: 33.6 g/dL (ref 31.5–35.7)
MCV: 80 fL (ref 79–97)
Platelets: 260 10*3/uL (ref 150–450)
RBC: 5.37 x10E6/uL (ref 4.14–5.80)
RDW: 15.2 % (ref 11.6–15.4)
WBC: 8.2 10*3/uL (ref 3.4–10.8)

## 2022-06-04 IMAGING — CT CT ANGIO CHEST
2 of 7 series · 17 of 46 positions shown · IV contrast (omnipaque)
Comparison: Radiograph earlier this day.

CLINICAL DATA: PE suspected, low/intermediate prob, positive
D-dimer

COVID positive with increasing shortness of breath.
EXAM:
CT ANGIOGRAPHY CHEST WITH CONTRAST
TECHNIQUE: Multidetector CT imaging of the chest was performed using the
standard protocol during bolus administration of intravenous
contrast. Multiplanar CT image reconstructions and MIPs were
obtained to evaluate the vascular anatomy.
CONTRAST:  73mL OMNIPAQUE IOHEXOL 350 MG/ML SOLN

[Series 6: thins · axial · 0.81mm/px · z∈[+1388,+1626]mm · 14 of 267 slices shown]
[im 15/267  lung]
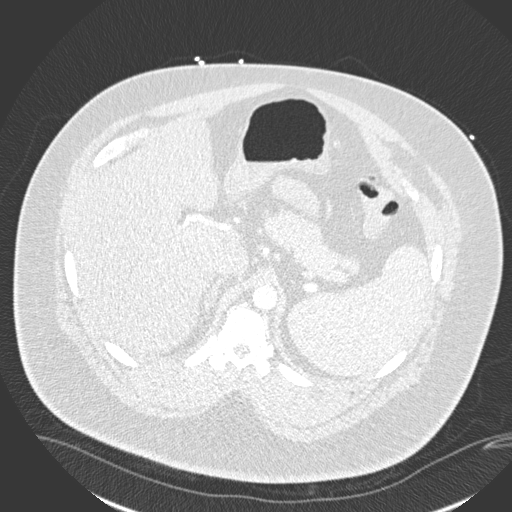
[im 29/267  soft-tissue]
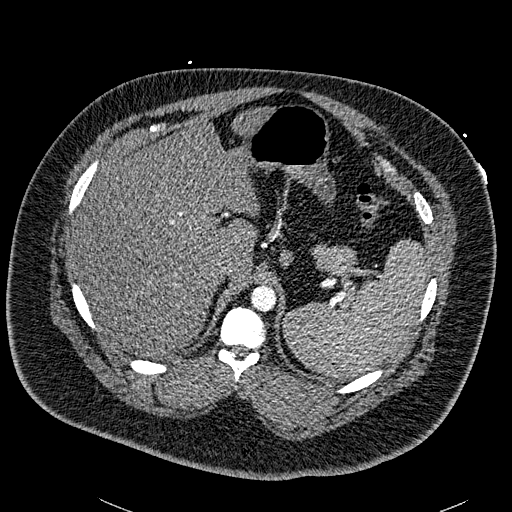
[im 57/267  lung]
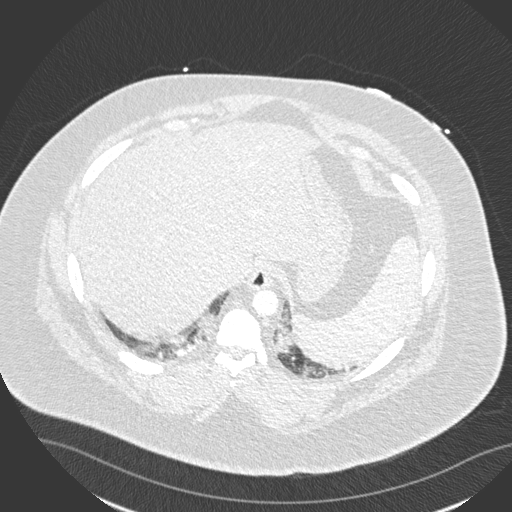
[im 71/267  soft-tissue]
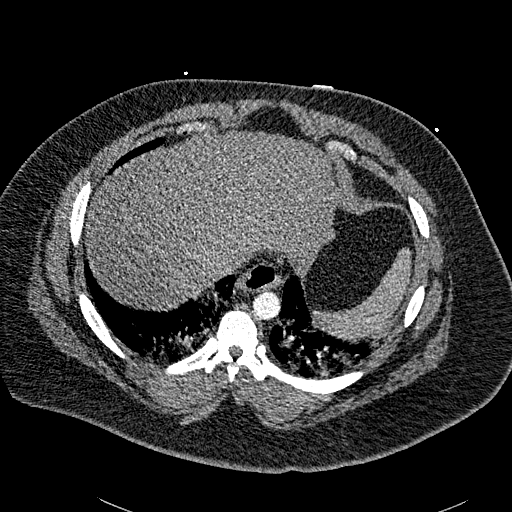
[im 85/267  lung]
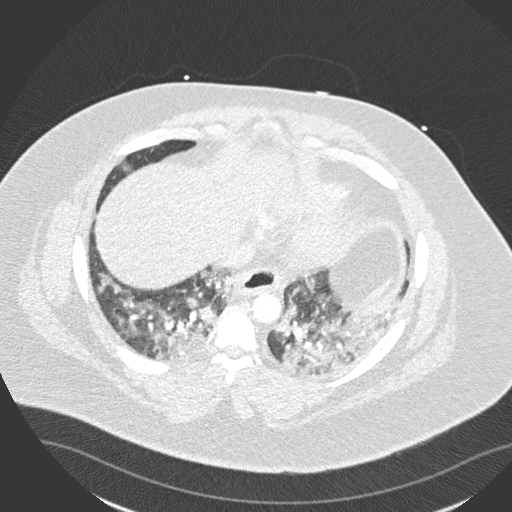
[im 113/267  soft-tissue]
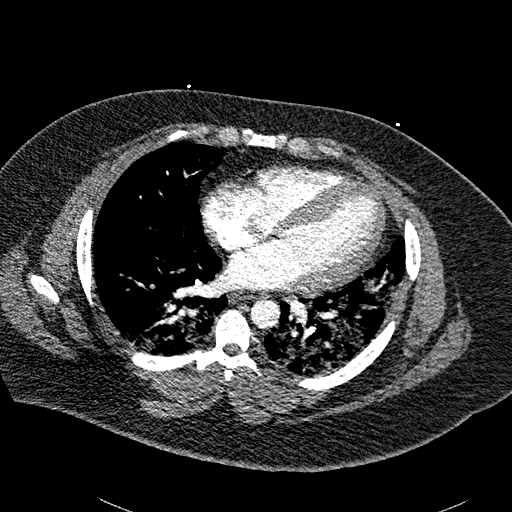
[im 127/267  lung]
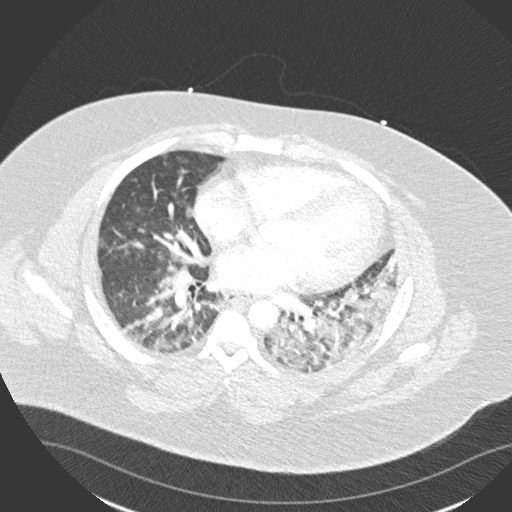
[im 141/267  soft-tissue]
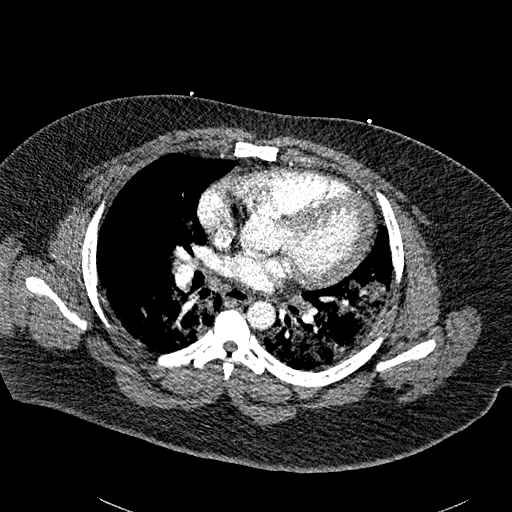
[im 155/267  lung]
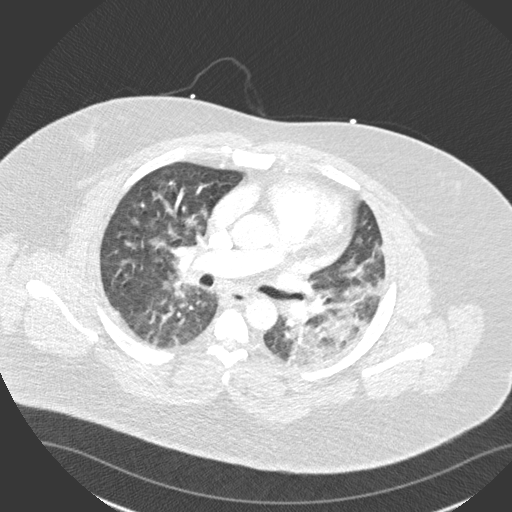
[im 183/267  soft-tissue]
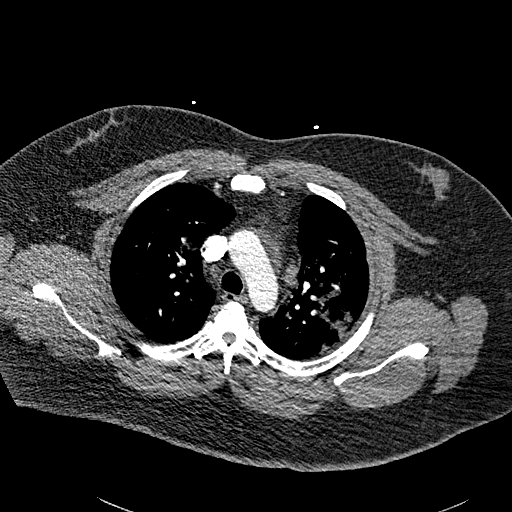
[im 197/267  lung]
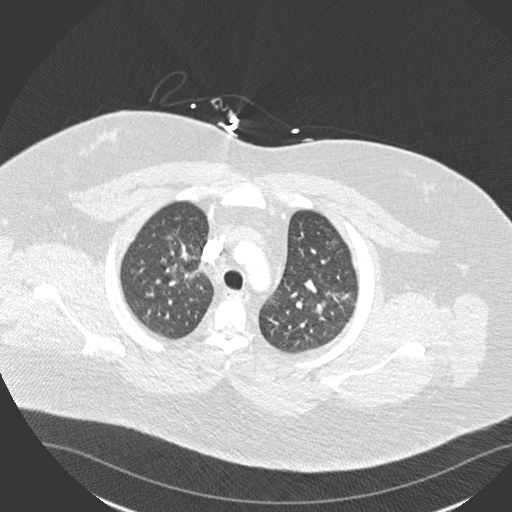
[im 211/267  soft-tissue]
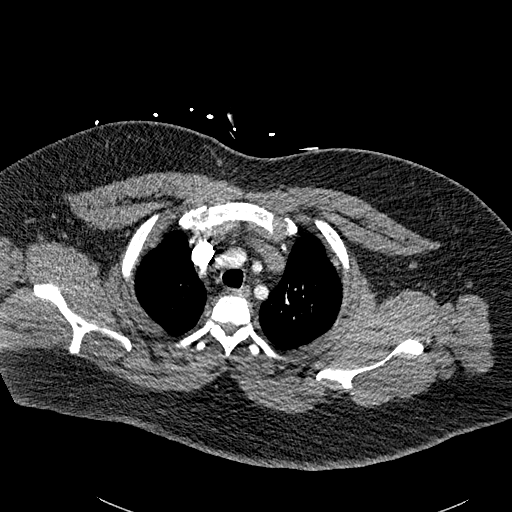
[im 239/267  lung]
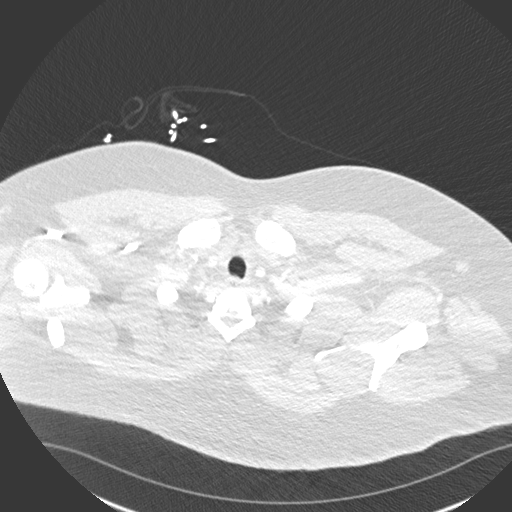
[im 253/267  soft-tissue]
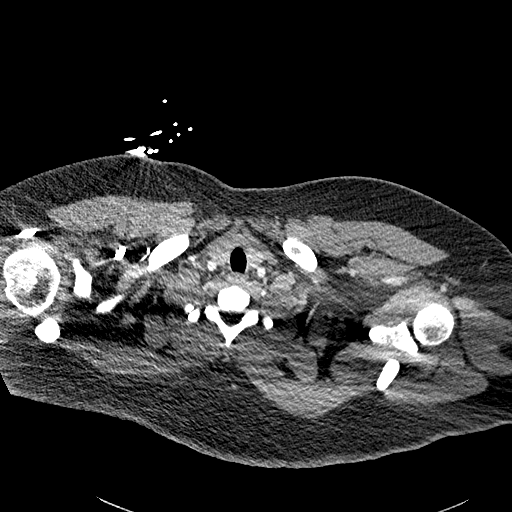

[Series 10: coronal mpr · coronal · 0.54mm/px · 3 of 151 slices shown]
[im 38/151  soft-tissue]
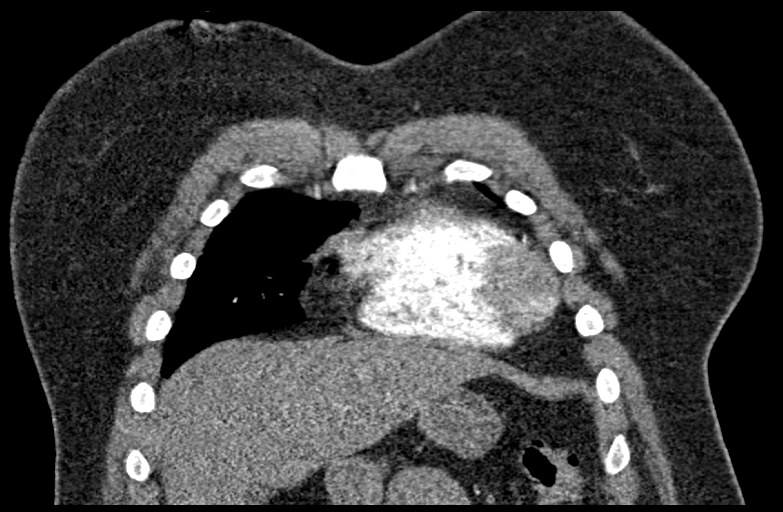
[im 76/151  soft-tissue]
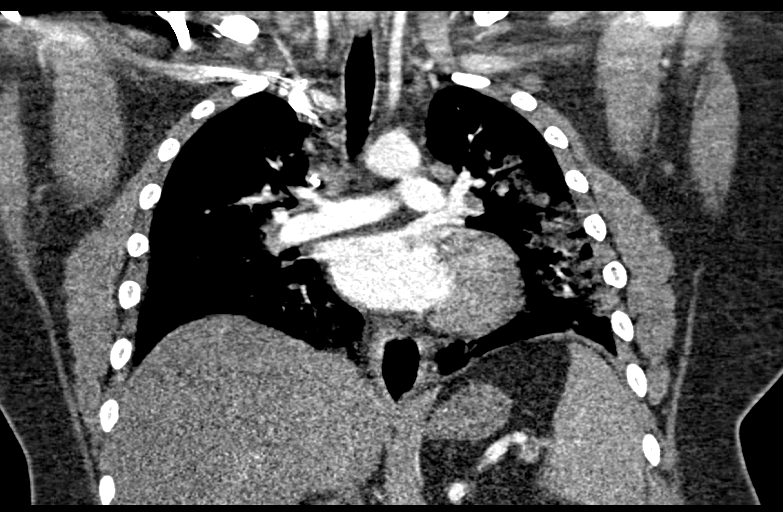
[im 113/151  soft-tissue]
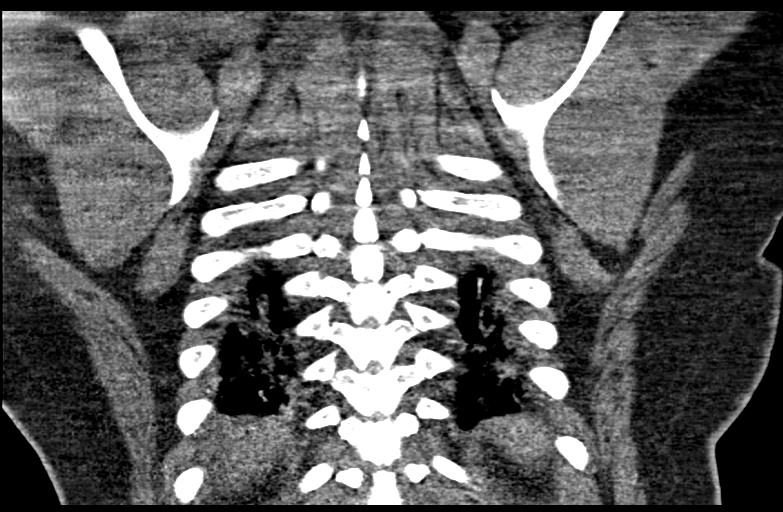

[17 of 46 positions shown; findings below may reference images not displayed]

FINDINGS: Cardiovascular: There are no filling defects within the pulmonary
arteries to suggest pulmonary embolus. The thoracic aorta is normal
in caliber. No aortic dissection. Heart is normal in size. No
pericardial effusion.

Mediastinum/Nodes: Patulous distal esophagus. No esophageal wall
thickening. Scattered mediastinal and hilar nodes, largest
prevascular measuring 11 mm, likely reactive. No suspicious thyroid
nodule. No pneumomediastinum.

Lungs/Pleura: Patchy bilateral ground-glass opacities, left greater
than right. No pleural fluid or pneumothorax. Trachea and central
bronchi are patent.

Upper Abdomen: No acute findings.  Suspected hepatic steatosis.

Musculoskeletal: Left gynecomastia. There are no acute or suspicious
osseous abnormalities.

Review of the MIP images confirms the above findings.
IMPRESSION: 1. No pulmonary embolus.
2. Patchy bilateral ground-glass opacities, left greater than right,
consistent with [id number] pneumonia. Parenchymal involvement is
moderate.

## 2022-06-21 IMAGING — CR DG CHEST 2V
2 series · 2 of 2 positions shown · non-contrast
Comparison: Most recent radiograph and CT 12/10/2019

CLINICAL DATA: COVID pneumonia.

EXAM:
CHEST - 2 VIEW

[w chest pa]
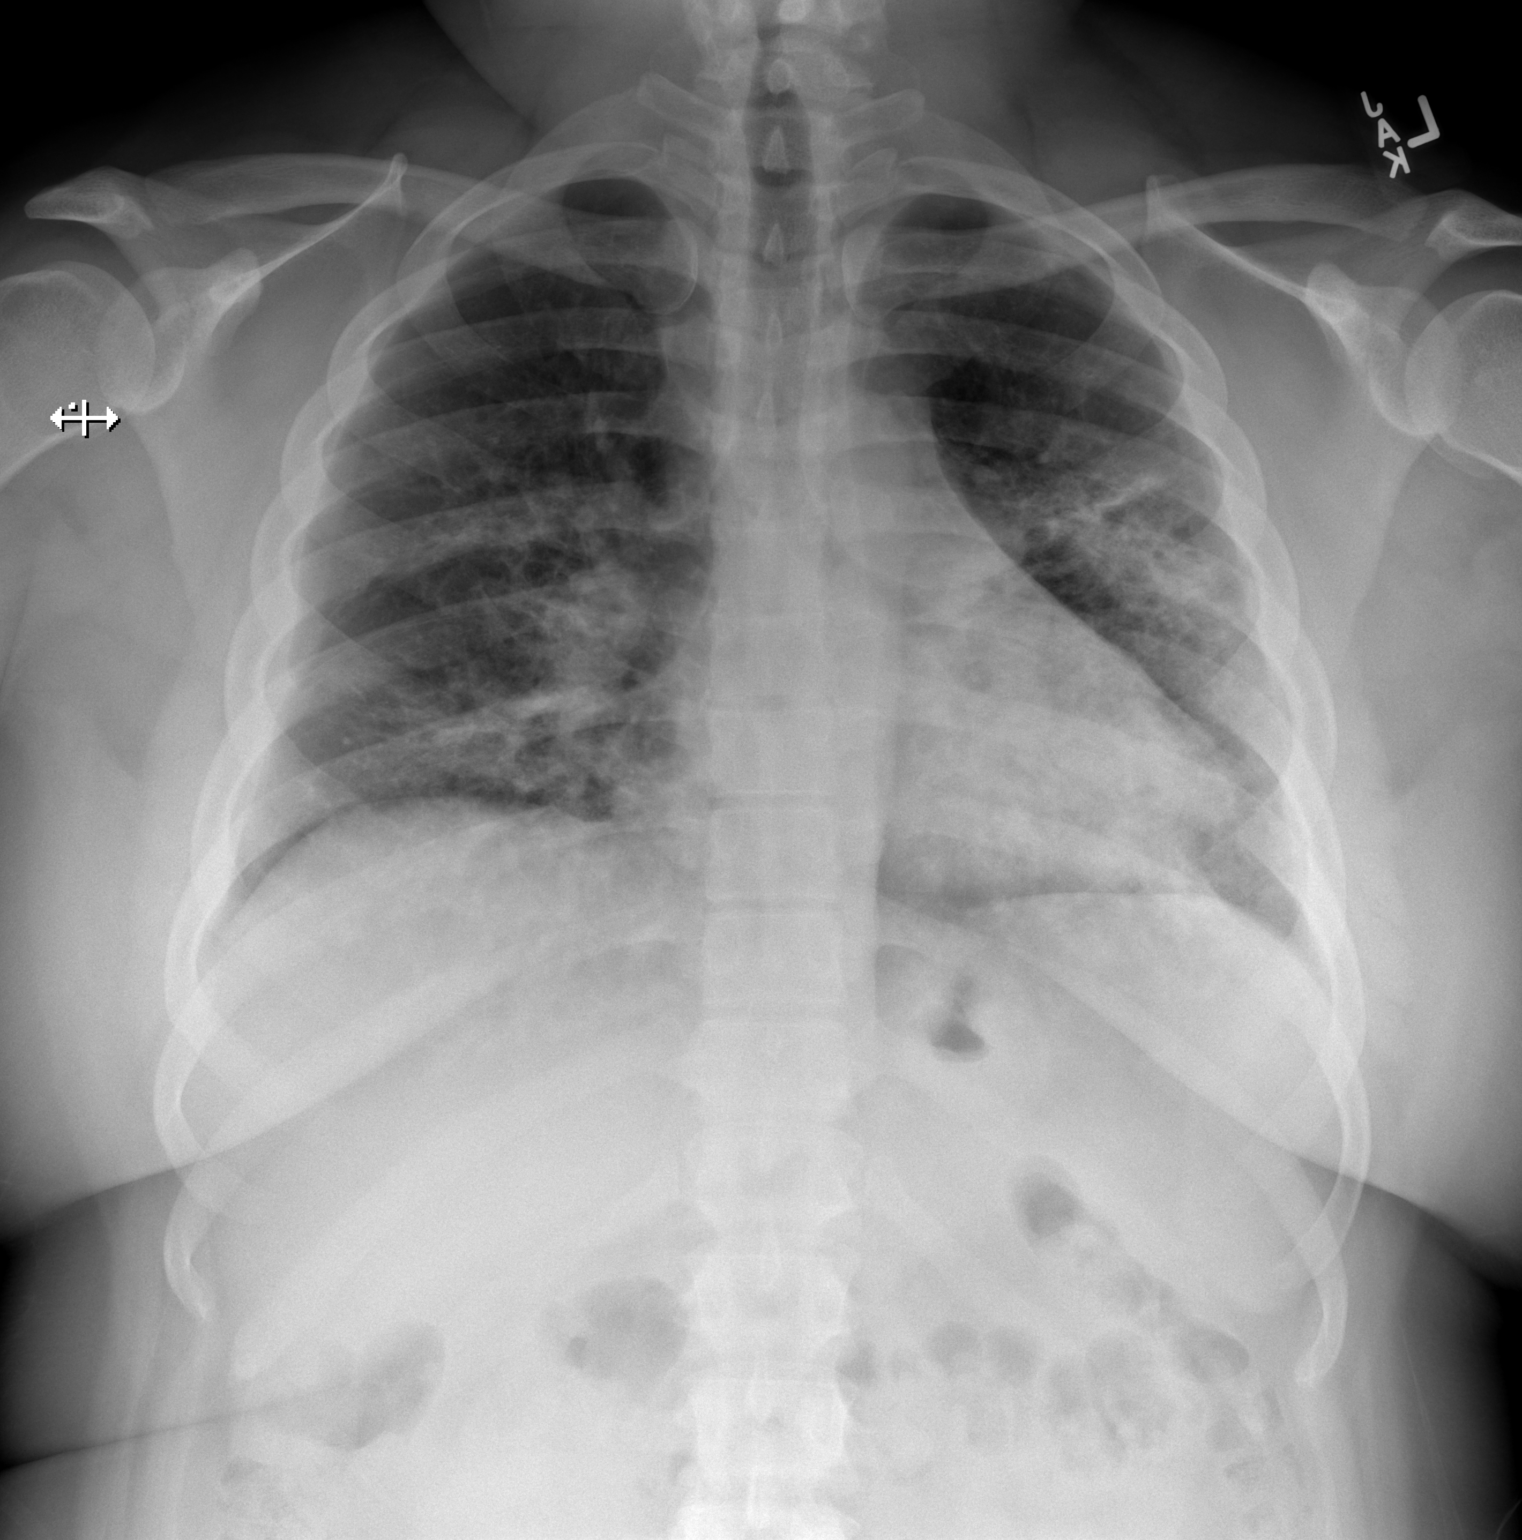

[w chest lat]
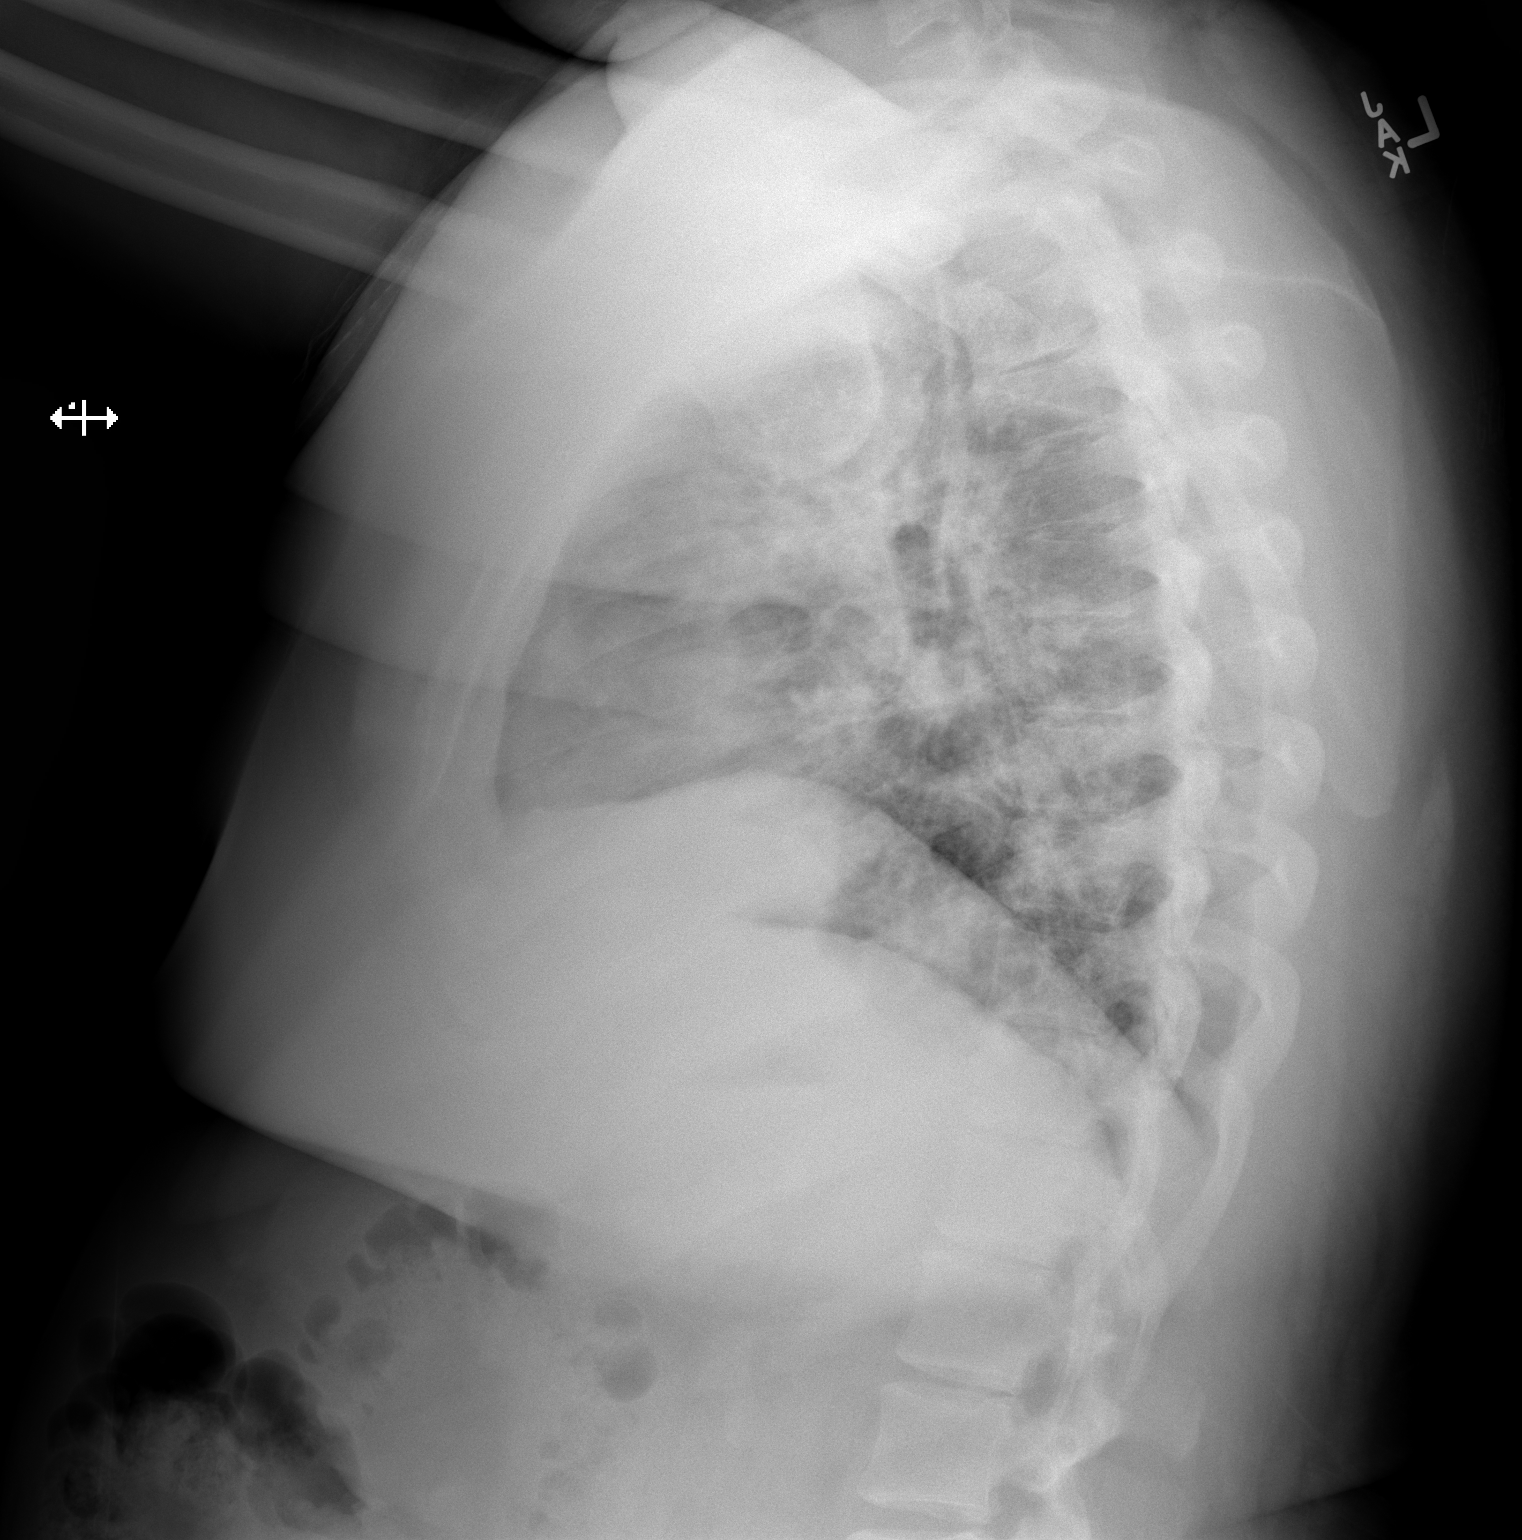

[2 of 2 positions shown; findings below may reference images not displayed]

FINDINGS: Lung volumes are low. Patchy heterogeneous bilateral airspace
opacities in a mid-lower lung zone predominant distribution. The
degree of parenchymal involvement is increased in the mid lung zones
from prior exam. No pneumothorax or evidence of pneumomediastinum.
No pleural fluid. No acute osseous abnormalities are seen.
IMPRESSION: Bilateral patchy heterogeneous airspace opacities, pattern
consistent with [id number] pneumonia. Worsening airspace disease in
the mid lung zones from exams earlier this month.

## 2022-07-27 ENCOUNTER — Other Ambulatory Visit: Payer: Self-pay

## 2022-07-27 DIAGNOSIS — I152 Hypertension secondary to endocrine disorders: Secondary | ICD-10-CM

## 2022-07-27 MED ORDER — LISINOPRIL 10 MG PO TABS: 10.0000 mg | ORAL_TABLET | Freq: Every day | ORAL | 0 refills | Status: DC

## 2022-07-27 NOTE — Telephone Encounter (Signed)
From: Tish Frederickson To: Office of Donell Beers, [place] Sent: 07/27/2022 7:10 AM EDT Subject: Medication Renewal Request  Refills have been requested for the following medications:   lisinopril (ZESTRIL) 10 MG tablet [[facility]]  Preferred pharmacy: [id number] - [place], [place] - [address] Delivery method: [employer]

## 2022-09-17 ENCOUNTER — Ambulatory Visit: Payer: Self-pay | Admitting: Nurse Practitioner

## 2022-09-20 ENCOUNTER — Ambulatory Visit: Payer: Self-pay | Admitting: Nurse Practitioner

## 2022-09-23 ENCOUNTER — Other Ambulatory Visit: Payer: Self-pay | Admitting: Nurse Practitioner

## 2022-09-23 DIAGNOSIS — E119 Type 2 diabetes mellitus without complications: Secondary | ICD-10-CM

## 2022-10-11 ENCOUNTER — Telehealth: Payer: Self-pay

## 2022-10-11 NOTE — Telephone Encounter (Signed)
Left a message

## 2022-10-11 NOTE — Telephone Encounter (Signed)
Lvm or pt to advise of eye exam appointment tomorrow at 1:30 pm here. KH

## 2022-10-21 ENCOUNTER — Ambulatory Visit (INDEPENDENT_AMBULATORY_CARE_PROVIDER_SITE_OTHER): Payer: Self-pay | Admitting: Nurse Practitioner

## 2022-10-21 VITALS — BP 128/72 | HR 66 | Ht 65.0 in | Wt 217.0 lb

## 2022-10-21 DIAGNOSIS — R1032 Left lower quadrant pain: Secondary | ICD-10-CM

## 2022-10-21 DIAGNOSIS — K5900 Constipation, unspecified: Secondary | ICD-10-CM

## 2022-10-21 DIAGNOSIS — E119 Type 2 diabetes mellitus without complications: Secondary | ICD-10-CM

## 2022-10-21 LAB — POCT GLYCOSYLATED HEMOGLOBIN (HGB A1C): Hemoglobin A1C: 5 % (ref 4.0–5.6)

## 2022-10-21 MED ORDER — SENNA-DOCUSATE SODIUM 8.6-50 MG PO TABS
1.0000 | ORAL_TABLET | Freq: Every day | ORAL | 0 refills | Status: AC
Start: 2022-10-21 — End: 2022-11-20

## 2022-10-21 NOTE — Progress Notes (Signed)
@Patient  ID: Mitchell Steele, male    DOB: [birth year], 27 y.o.   MRN: [id number]  Chief Complaint  Patient presents with   Abdominal Pain    Pt is experiencing stomach pain for a couple of months now. Constipation pt stated that he is been using different  types of stool softener and is not working.    Referring provider: Ivonne Andrew, NP   HPI  Patient presents today for left lower quadrant abdominal pain.  He states that he has been having this issue for the past couple months intermittently.  He does have chronic constipation.  He has used MiraLAX and Colace with minimal relief noted.  We will try Senokot.  Samples of Restora given in office today.  Patient states last bowel movement was this morning but it was a small bowel movement he was constipated.  Will refer him to GI ultrasound. Denies f/c/s, n/v/d, hemoptysis, PND, leg swelling Denies chest pain or edema          No Known Allergies  Immunization History  Administered Date(s) Administered   HPV Quadrivalent 04/03/2012   Influenza, Seasonal, Injecte, Preservative Fre 04/03/2012    Past Medical History:  Diagnosis Date   Cancer (HCC)    Diabetes mellitus without complication (HCC)    Eczema    Migraine    Obesity     Tobacco History: Social History   Tobacco Use  Smoking Status Former   Current packs/day: 0.00   Types: Cigars, Cigarettes   Quit date: 08/09/2021   Years since quitting: 1.2  Smokeless Tobacco Never   Counseling given: Not Answered   Outpatient Encounter Medications as of 10/21/2022  Medication Sig   lisinopril (ZESTRIL) 10 MG tablet Take 1 tablet (10 mg total) by mouth daily.   metFORMIN (GLUCOPHAGE-XR) 500 MG 24 hr tablet Take 1 tablet by mouth once daily with breakfast   sennosides-docusate sodium (SENOKOT-S) 8.6-50 MG tablet Take 1 tablet by mouth daily.   cyclobenzaprine (FLEXERIL) 10 MG tablet Take 1 tablet (10 mg total) by mouth 2 (two) times daily as needed for muscle  spasms. (Patient not taking: Reported on 04/09/2022)   docusate sodium (COLACE) 100 MG capsule Take 1 capsule (100 mg total) by mouth 2 (two) times daily. (Patient not taking: Reported on 10/21/2022)   lidocaine (LIDODERM) 5 % Place 1 patch onto the skin daily. Remove & Discard patch within 12 hours or as directed by MD (Patient not taking: Reported on 04/09/2022)   naproxen (NAPROSYN) 500 MG tablet Take 1 tablet (500 mg total) by mouth 2 (two) times daily. (Patient not taking: Reported on 04/09/2022)   polyethylene glycol (MIRALAX) 17 g packet Take 17 g by mouth daily as needed. (Patient not taking: Reported on 05/13/2022)   triamcinolone cream (KENALOG) 0.1 % Apply 1 application topically 2 (two) times daily. (Patient not taking: Reported on 10/21/2022)   No facility-administered encounter medications on file as of 10/21/2022.     Review of Systems  Review of Systems  Constitutional: Negative.   HENT: Negative.    Cardiovascular: Negative.   Gastrointestinal:  Positive for abdominal distention, abdominal pain and constipation. Negative for nausea and vomiting.  Allergic/Immunologic: Negative.   Neurological: Negative.   Psychiatric/Behavioral: Negative.         Physical Exam  BP 128/72   Pulse 66   Ht 5\' 5"  (1.651 m)   Wt 217 lb (98.4 kg)   SpO2 100%   BMI 36.11 kg/m   Wt Readings from  Last 5 Encounters:  10/21/22 217 lb (98.4 kg)  05/13/22 230 lb (104.3 kg)  04/09/22 226 lb 3.2 oz (102.6 kg)  02/10/22 236 lb (107 kg)  11/09/21 260 lb 12.8 oz (118.3 kg)     Physical Exam Vitals and nursing note reviewed.  Constitutional:      General: He is not in acute distress.    Appearance: He is well-developed.  Cardiovascular:     Rate and Rhythm: Normal rate and regular rhythm.  Pulmonary:     Effort: Pulmonary effort is normal.     Breath sounds: Normal breath sounds.  Abdominal:     Tenderness: There is abdominal tenderness in the left lower quadrant.  Skin:    General: Skin  is warm and dry.     Findings: No rash.  Neurological:     Mental Status: He is alert and oriented to person, place, and time.      Lab Results:  CBC    Component Value Date/Time   WBC 8.2 05/13/2022 1112   WBC 11.6 (H) 12/16/2019 0431   RBC 5.37 05/13/2022 1112   RBC 5.48 12/16/2019 0431   HGB 14.4 05/13/2022 1112   HCT 42.9 05/13/2022 1112   PLT 260 05/13/2022 1112   MCV 80 05/13/2022 1112   MCH 26.8 05/13/2022 1112   MCH 25.9 (L) 12/16/2019 0431   MCHC 33.6 05/13/2022 1112   MCHC 33.1 12/16/2019 0431   RDW 15.2 05/13/2022 1112   LYMPHSABS 2.3 12/16/2019 0431   MONOABS 0.9 12/16/2019 0431   EOSABS 0.0 12/16/2019 0431   BASOSABS 0.1 12/16/2019 0431    BMET    Component Value Date/Time   NA 140 05/13/2022 1112   K 4.5 05/13/2022 1112   CL 100 05/13/2022 1112   CO2 25 05/13/2022 1112   GLUCOSE 83 05/13/2022 1112   GLUCOSE 111 (H) 12/16/2019 0431   BUN 12 05/13/2022 1112   CREATININE 1.00 05/13/2022 1112   CALCIUM 9.9 05/13/2022 1112   GFRNONAA 123 12/27/2019 1511   GFRAA 142 12/27/2019 1511    BNP No results found for: "BNP"  ProBNP No results found for: "PROBNP"  Imaging: No results found.   Assessment & Plan:   Constipation - sennosides-docusate sodium (SENOKOT-S) 8.6-50 MG tablet; Take 1 tablet by mouth daily.  Dispense: 30 tablet; Refill: 0 - US Abdomen Complete - CBC - Comprehensive metabolic panel  2. Type 2 diabetes mellitus without complication, without long-term current use of insulin (HCC)  - Microalbumin / creatinine urine ratio - CBC - Comprehensive metabolic panel  3. Left lower quadrant pain  - US Abdomen Complete - CBC - Comprehensive metabolic panel  Follow up:  Follow up in 3 months     Ivonne Andrew, NP 10/21/2022

## 2022-10-21 NOTE — Assessment & Plan Note (Signed)
-   sennosides-docusate sodium (SENOKOT-S) 8.6-50 MG tablet; Take 1 tablet by mouth daily.  Dispense: 30 tablet; Refill: 0 - US Abdomen Complete - CBC - Comprehensive metabolic panel  2. Type 2 diabetes mellitus without complication, without long-term current use of insulin (HCC)  - Microalbumin / creatinine urine ratio - CBC - Comprehensive metabolic panel  3. Left lower quadrant pain  - US Abdomen Complete - CBC - Comprehensive metabolic panel  Follow up:  Follow up in 3 months

## 2022-10-21 NOTE — Patient Instructions (Addendum)
1. Constipation, unspecified constipation type  - sennosides-docusate sodium (SENOKOT-S) 8.6-50 MG tablet; Take 1 tablet by mouth daily.  Dispense: 30 tablet; Refill: 0 - US Abdomen Complete - CBC - Comprehensive metabolic panel  2. Type 2 diabetes mellitus without complication, without long-term current use of insulin (HCC)  - Microalbumin / creatinine urine ratio - CBC - Comprehensive metabolic panel  3. Left lower quadrant pain  - US Abdomen Complete - CBC - Comprehensive metabolic panel  Follow up:  Follow up in 3 months   Constipation, Adult Constipation is when a person has trouble pooping (having a bowel movement). When you have this condition, you may poop fewer than 3 times a week. Your poop (stool) may also be dry, hard, or bigger than normal. Follow these instructions at home: Eating and drinking  Eat foods that have a lot of fiber, such as: Fresh fruits and vegetables. Whole grains. Beans. Eat less of foods that are low in fiber and high in fat and sugar, such as: [place] fries. Hamburgers. Cookies. Candy. Soda. Drink enough fluid to keep your pee (urine) pale yellow. General instructions Exercise regularly or as told by your doctor. Try to do 150 minutes of exercise each week. Go to the restroom when you feel like you need to poop. Do not hold it in. Take over-the-counter and prescription medicines only as told by your doctor. These include any fiber supplements. When you poop: Do deep breathing while relaxing your lower belly (abdomen). Relax your pelvic floor. The pelvic floor is a group of muscles that support the rectum, bladder, and intestines (as well as the uterus in women). Watch your condition for any changes. Tell your doctor if you notice any. Keep all follow-up visits as told by your doctor. This is important. Contact a doctor if: You have pain that gets worse. You have a fever. You have not pooped for 4 days. You vomit. You are not  hungry. You lose weight. You are bleeding from the opening of the butt (anus). You have thin, pencil-like poop. Get help right away if: You have a fever, and your symptoms suddenly get worse. You leak poop or have blood in your poop. Your belly feels hard or bigger than normal (bloated). You have very bad belly pain. You feel dizzy or you faint. Summary Constipation is when a person poops fewer than 3 times a week, has trouble pooping, or has poop that is dry, hard, or bigger than normal. Eat foods that have a lot of fiber. Drink enough fluid to keep your pee (urine) pale yellow. Take over-the-counter and prescription medicines only as told by your doctor. These include any fiber supplements. This information is not intended to replace advice given to you by your health care provider. Make sure you discuss any questions you have with your health care provider. Document Revised: 02/03/2022 Document Reviewed: 02/03/2022 Elsevier Patient Education  2024 [employer].

## 2022-10-22 ENCOUNTER — Ambulatory Visit (HOSPITAL_COMMUNITY): Payer: Medicaid Other

## 2022-10-22 LAB — COMPREHENSIVE METABOLIC PANEL
ALT: 12 IU/L (ref 0–44)
AST: 10 IU/L (ref 0–40)
Albumin: 4.5 g/dL (ref 4.3–5.2)
Alkaline Phosphatase: 64 IU/L (ref 44–121)
BUN/Creatinine Ratio: 13 (ref 9–20)
BUN: 15 mg/dL (ref 6–20)
Bilirubin Total: 0.5 mg/dL (ref 0.0–1.2)
CO2: 26 mmol/L (ref 20–29)
Calcium: 9.7 mg/dL (ref 8.7–10.2)
Chloride: 100 mmol/L (ref 96–106)
Creatinine, Ser: 1.13 mg/dL (ref 0.76–1.27)
Globulin, Total: 2.5 g/dL (ref 1.5–4.5)
Glucose: 76 mg/dL (ref 70–99)
Potassium: 4 mmol/L (ref 3.5–5.2)
Sodium: 139 mmol/L (ref 134–144)
Total Protein: 7 g/dL (ref 6.0–8.5)
eGFR: 91 mL/min/{1.73_m2} (ref >59)

## 2022-10-22 LAB — CBC
Hematocrit: 44 % (ref 37.5–51.0)
Hemoglobin: 14.2 g/dL (ref 13.0–17.7)
MCH: 26.4 pg — ABNORMAL LOW (ref 26.6–33.0)
MCHC: 32.3 g/dL (ref 31.5–35.7)
MCV: 82 fL (ref 79–97)
Platelets: 253 10*3/uL (ref 150–450)
RBC: 5.38 x10E6/uL (ref 4.14–5.80)
RDW: 14.3 % (ref 11.6–15.4)
WBC: 8 10*3/uL (ref 3.4–10.8)

## 2022-10-22 LAB — MICROALBUMIN / CREATININE URINE RATIO
Creatinine, Urine: 138.1 mg/dL
Microalb/Creat Ratio: <2 mg/g creat (ref 0–29)
Microalbumin, Urine: <3 ug/mL

## 2022-10-26 ENCOUNTER — Ambulatory Visit (HOSPITAL_COMMUNITY)
Admission: RE | Admit: 2022-10-26 | Discharge: 2022-10-26 | Disposition: A | Discharge status: Home / Self Care | Payer: Medicaid Other | Source: Ambulatory Visit | Attending: Nurse Practitioner | Admitting: Nurse Practitioner

## 2022-10-26 DIAGNOSIS — K5900 Constipation, unspecified: Secondary | ICD-10-CM | POA: Insufficient documentation

## 2022-10-26 DIAGNOSIS — R1032 Left lower quadrant pain: Secondary | ICD-10-CM | POA: Insufficient documentation

## 2022-11-01 ENCOUNTER — Other Ambulatory Visit: Payer: Self-pay | Admitting: Nurse Practitioner

## 2022-11-01 DIAGNOSIS — E119 Type 2 diabetes mellitus without complications: Secondary | ICD-10-CM

## 2022-11-01 MED ORDER — METFORMIN HCL ER 500 MG PO TB24: 500.0000 mg | ORAL_TABLET | Freq: Every day | ORAL | 0 refills | Status: DC

## 2022-11-04 ENCOUNTER — Encounter: Payer: Self-pay | Admitting: Physician Assistant

## 2022-11-11 ENCOUNTER — Ambulatory Visit: Payer: Self-pay | Admitting: Nurse Practitioner

## 2022-11-16 ENCOUNTER — Other Ambulatory Visit: Payer: Self-pay

## 2022-11-16 DIAGNOSIS — M25512 Pain in left shoulder: Secondary | ICD-10-CM

## 2022-11-16 NOTE — Telephone Encounter (Signed)
Please advise Kh 

## 2022-11-17 MED ORDER — CYCLOBENZAPRINE HCL 10 MG PO TABS
10.0000 mg | ORAL_TABLET | Freq: Two times a day (BID) | ORAL | 0 refills | Status: DC | PRN
Start: 2022-11-17 — End: 2023-03-16

## 2022-11-19 ENCOUNTER — Ambulatory Visit: Payer: Self-pay | Admitting: Nurse Practitioner

## 2022-11-29 ENCOUNTER — Other Ambulatory Visit: Payer: Self-pay | Admitting: Nurse Practitioner

## 2022-11-29 DIAGNOSIS — E119 Type 2 diabetes mellitus without complications: Secondary | ICD-10-CM

## 2022-11-29 MED ORDER — METFORMIN HCL ER 500 MG PO TB24
500.0000 mg | ORAL_TABLET | Freq: Every day | ORAL | 0 refills | Status: DC
Start: 2022-11-29 — End: 2022-12-27

## 2022-12-01 ENCOUNTER — Ambulatory Visit: Payer: Self-pay | Admitting: Nurse Practitioner

## 2022-12-27 ENCOUNTER — Ambulatory Visit: Payer: Self-pay | Admitting: Nurse Practitioner

## 2022-12-27 ENCOUNTER — Other Ambulatory Visit: Payer: Self-pay | Admitting: Nurse Practitioner

## 2022-12-27 DIAGNOSIS — E119 Type 2 diabetes mellitus without complications: Secondary | ICD-10-CM

## 2022-12-27 MED ORDER — METFORMIN HCL ER 500 MG PO TB24
500.0000 mg | ORAL_TABLET | Freq: Every day | ORAL | 0 refills | Status: DC
Start: 2022-12-27 — End: 2023-03-07

## 2022-12-30 ENCOUNTER — Ambulatory Visit: Payer: Self-pay | Admitting: Nurse Practitioner

## 2023-01-07 ENCOUNTER — Ambulatory Visit: Payer: Self-pay | Admitting: Nurse Practitioner

## 2023-01-19 ENCOUNTER — Ambulatory Visit: Payer: Medicaid Other | Admitting: Physician Assistant

## 2023-01-19 NOTE — Progress Notes (Deleted)
01/19/2023 Mitchell Steele [id number] 1995/09/01  Referring provider: Ivonne Andrew, NP Primary GI doctor: {acdocs:[id number]}  ASSESSMENT AND PLAN:   Assessment and Plan              Patient Care Team: Ivonne Andrew, NP as PCP - General (Pulmonary Disease)  HISTORY OF PRESENT ILLNESS: 27 y.o. male with a past medical history of hypertension, migraines type 2 diabetes, eczema, depression/anxiety, obesity and others listed below presents for evaluation of constipation.   10/26/2022 pelvic ultrasound for left lower quadrant abdominal pain for 2 months showed no abnormality 10/21/2022 CBC showed Hgb 14.2, MCV 82, MCH 26.4 normal platelets no leukocytosis.  Patient has had a microcytosis 3 years ago. Normal kidney and liver, 3 years ago also had elevated ALT isolation 9389 and 67 which is resolved  Discussed the use of AI scribe software for clinical note transcription with the patient, who gave verbal consent to proceed.  History of Present Illness             He {Actions; denies-reports:120008} blood thinner use.  He {Actions; denies-reports:120008} NSAID use.  He {Actions; denies-reports:120008} ETOH use.   He {Actions; denies-reports:120008} tobacco use.  He {Actions; denies-reports:120008} drug use.    He  reports that he quit smoking about 17 months ago. His smoking use included cigars and cigarettes. He has never used smokeless tobacco. He reports that he does not drink alcohol and does not use drugs.  RELEVANT LABS AND IMAGING:  Results          CBC    Component Value Date/Time   WBC 8.0 10/21/2022 1417   WBC 11.6 (H) 12/16/2019 0431   RBC 5.38 10/21/2022 1417   RBC 5.48 12/16/2019 0431   HGB 14.2 10/21/2022 1417   HCT 44.0 10/21/2022 1417   PLT 253 10/21/2022 1417   MCV 82 10/21/2022 1417   MCH 26.4 (L) 10/21/2022 1417   MCH 25.9 (L) 12/16/2019 0431   MCHC 32.3 10/21/2022 1417   MCHC 33.1 12/16/2019 0431   RDW 14.3 10/21/2022 1417    LYMPHSABS 2.3 12/16/2019 0431   MONOABS 0.9 12/16/2019 0431   EOSABS 0.0 12/16/2019 0431   BASOSABS 0.1 12/16/2019 0431   Recent Labs    05/13/22 1112 10/21/22 1417  HGB 14.4 14.2    CMP     Component Value Date/Time   NA 139 10/21/2022 1417   K 4.0 10/21/2022 1417   CL 100 10/21/2022 1417   CO2 26 10/21/2022 1417   GLUCOSE 76 10/21/2022 1417   GLUCOSE 111 (H) 12/16/2019 0431   BUN 15 10/21/2022 1417   CREATININE 1.13 10/21/2022 1417   CALCIUM 9.7 10/21/2022 1417   PROT 7.0 10/21/2022 1417   ALBUMIN 4.5 10/21/2022 1417   AST 10 10/21/2022 1417   ALT 12 10/21/2022 1417   ALKPHOS 64 10/21/2022 1417   BILITOT 0.5 10/21/2022 1417   GFRNONAA 123 12/27/2019 1511   GFRAA 142 12/27/2019 1511      Latest Ref Rng & Units 10/21/2022    2:17 PM 05/13/2022   11:12 AM 11/09/2021    3:56 PM  Hepatic Function  Total Protein 6.0 - 8.5 g/dL 7.0  7.2  7.4   Albumin 4.3 - 5.2 g/dL 4.5  4.8  4.6   AST 0 - 40 IU/L 10  15  10    ALT 0 - 44 IU/L 12  18  16    Alk Phosphatase 44 - 121 IU/L 64  86  87  Total Bilirubin 0.0 - 1.2 mg/dL 0.5  0.4  0.3       Current Medications:   Current Outpatient Medications (Endocrine & Metabolic):    metFORMIN (GLUCOPHAGE-XR) 500 MG 24 hr tablet, Take 1 tablet (500 mg total) by mouth daily with breakfast.  Current Outpatient Medications (Cardiovascular):    lisinopril (ZESTRIL) 10 MG tablet, Take 1 tablet (10 mg total) by mouth daily.   Current Outpatient Medications (Analgesics):    naproxen (NAPROSYN) 500 MG tablet, Take 1 tablet (500 mg total) by mouth 2 (two) times daily. (Patient not taking: Reported on 04/09/2022)   Current Outpatient Medications (Other):    cyclobenzaprine (FLEXERIL) 10 MG tablet, Take 1 tablet (10 mg total) by mouth 2 (two) times daily as needed for muscle spasms.   docusate sodium (COLACE) 100 MG capsule, Take 1 capsule (100 mg total) by mouth 2 (two) times daily. (Patient not taking: Reported on 10/21/2022)   lidocaine  (LIDODERM) 5 %, Place 1 patch onto the skin daily. Remove & Discard patch within 12 hours or as directed by MD (Patient not taking: Reported on 04/09/2022)   polyethylene glycol (MIRALAX) 17 g packet, Take 17 g by mouth daily as needed. (Patient not taking: Reported on 05/13/2022)   triamcinolone cream (KENALOG) 0.1 %, Apply 1 application topically 2 (two) times daily. (Patient not taking: Reported on 10/21/2022)  Medical History:  Past Medical History:  Diagnosis Date   Cancer (HCC)    Diabetes mellitus without complication (HCC)    Eczema    Migraine    Obesity    Allergies: No Known Allergies   Surgical History:  He  has no past surgical history on file. Family History:  His family history includes Alcohol abuse in his maternal uncle; Diabetes in his maternal grandmother and mother; Heart attack in his maternal grandmother; Heart disease in his maternal grandmother; Hypertension in his maternal grandmother.  REVIEW OF SYSTEMS  : All other systems reviewed and negative except where noted in the History of Present Illness.  PHYSICAL EXAM: There were no vitals taken for this visit. General Appearance: Well nourished, in no apparent distress. Head:   Normocephalic and atraumatic. Eyes:  sclerae anicteric,conjunctive pink  Respiratory: Respiratory effort normal, BS equal bilaterally without rales, rhonchi, wheezing. Cardio: RRR with no MRGs. Peripheral pulses intact.  Abdomen: Soft,  {BlankSingle:19197::"Flat","Obese","Non-distended"} ,active bowel sounds. {actendernessAB:27319} tenderness {anatomy; site abdomen:5010}. {BlankMultiple:19196::"Without guarding","With guarding","Without rebound","With rebound"}. No masses. Rectal: {acrectalexam:27461} Musculoskeletal: Full ROM, {PSY - GAIT AND STATION:22860} gait. {With/Without:[id number]} edema. Skin:  Dry and intact without significant lesions or rashes Neuro: Alert and  oriented x4;  No focal deficits. Psych:  Cooperative. Normal mood and  affect.    Doree Albee, PA-C 8:09 AM

## 2023-03-07 ENCOUNTER — Other Ambulatory Visit: Payer: Self-pay | Admitting: Nurse Practitioner

## 2023-03-07 DIAGNOSIS — E119 Type 2 diabetes mellitus without complications: Secondary | ICD-10-CM

## 2023-03-07 MED ORDER — METFORMIN HCL ER 500 MG PO TB24
500.0000 mg | ORAL_TABLET | Freq: Every day | ORAL | 0 refills | Status: DC
Start: 2023-03-07 — End: 2023-03-16

## 2023-03-07 MED ORDER — METFORMIN HCL ER 500 MG PO TB24
500.0000 mg | ORAL_TABLET | Freq: Every day | ORAL | 0 refills | Status: DC
Start: 2023-03-07 — End: 2023-04-11

## 2023-03-16 ENCOUNTER — Other Ambulatory Visit: Payer: Self-pay

## 2023-03-16 DIAGNOSIS — E119 Type 2 diabetes mellitus without complications: Secondary | ICD-10-CM

## 2023-03-16 DIAGNOSIS — M25512 Pain in left shoulder: Secondary | ICD-10-CM

## 2023-03-16 MED ORDER — METFORMIN HCL ER 500 MG PO TB24
500.0000 mg | ORAL_TABLET | Freq: Every day | ORAL | 0 refills | Status: DC
Start: 2023-03-16 — End: 2023-06-14

## 2023-03-16 MED ORDER — CYCLOBENZAPRINE HCL 10 MG PO TABS
10.0000 mg | ORAL_TABLET | Freq: Two times a day (BID) | ORAL | 0 refills | Status: DC | PRN
Start: 2023-03-16 — End: 2023-04-28

## 2023-03-16 NOTE — Telephone Encounter (Signed)
Please advise KH 

## 2023-03-17 ENCOUNTER — Other Ambulatory Visit: Payer: Self-pay

## 2023-03-17 DIAGNOSIS — M25512 Pain in left shoulder: Secondary | ICD-10-CM

## 2023-03-17 MED ORDER — NAPROXEN 500 MG PO TABS
500.0000 mg | ORAL_TABLET | Freq: Two times a day (BID) | ORAL | 0 refills | Status: DC
Start: 2023-03-17 — End: 2023-04-28

## 2023-03-17 NOTE — Telephone Encounter (Signed)
Please advise KH 

## 2023-04-11 ENCOUNTER — Other Ambulatory Visit: Payer: Self-pay

## 2023-04-11 DIAGNOSIS — I152 Hypertension secondary to endocrine disorders: Secondary | ICD-10-CM

## 2023-04-11 DIAGNOSIS — E119 Type 2 diabetes mellitus without complications: Secondary | ICD-10-CM

## 2023-04-11 MED ORDER — METFORMIN HCL ER 500 MG PO TB24
500.0000 mg | ORAL_TABLET | Freq: Every day | ORAL | 0 refills | Status: DC
Start: 2023-04-11 — End: 2023-04-27

## 2023-04-11 MED ORDER — LISINOPRIL 10 MG PO TABS: 10.0000 mg | ORAL_TABLET | Freq: Every day | ORAL | 0 refills | Status: DC

## 2023-04-27 ENCOUNTER — Other Ambulatory Visit: Payer: Self-pay | Admitting: Nurse Practitioner

## 2023-04-27 DIAGNOSIS — E119 Type 2 diabetes mellitus without complications: Secondary | ICD-10-CM

## 2023-04-27 MED ORDER — METFORMIN HCL ER 500 MG PO TB24
500.0000 mg | ORAL_TABLET | Freq: Every day | ORAL | 0 refills | Status: DC
Start: 2023-04-27 — End: 2023-04-28

## 2023-04-28 ENCOUNTER — Ambulatory Visit (INDEPENDENT_AMBULATORY_CARE_PROVIDER_SITE_OTHER): Payer: Self-pay | Admitting: Nurse Practitioner

## 2023-04-28 ENCOUNTER — Encounter: Payer: Self-pay | Admitting: Nurse Practitioner

## 2023-04-28 VITALS — BP 123/76 | HR 68 | Temp 97.2°F | Wt 242.0 lb

## 2023-04-28 DIAGNOSIS — R0781 Pleurodynia: Secondary | ICD-10-CM

## 2023-04-28 DIAGNOSIS — Z0000 Encounter for general adult medical examination without abnormal findings: Secondary | ICD-10-CM

## 2023-04-28 DIAGNOSIS — Z1159 Encounter for screening for other viral diseases: Secondary | ICD-10-CM

## 2023-04-28 DIAGNOSIS — Z13220 Encounter for screening for lipoid disorders: Secondary | ICD-10-CM

## 2023-04-28 DIAGNOSIS — Z1329 Encounter for screening for other suspected endocrine disorder: Secondary | ICD-10-CM

## 2023-04-28 DIAGNOSIS — K5900 Constipation, unspecified: Secondary | ICD-10-CM

## 2023-04-28 DIAGNOSIS — E119 Type 2 diabetes mellitus without complications: Secondary | ICD-10-CM

## 2023-04-28 LAB — POCT GLYCOSYLATED HEMOGLOBIN (HGB A1C): Hemoglobin A1C: 4.9 % (ref 4.0–5.6)

## 2023-04-28 MED ORDER — PREDNISONE 20 MG PO TABS: 20.0000 mg | ORAL_TABLET | Freq: Every day | ORAL | 0 refills | Status: AC

## 2023-04-28 MED ORDER — POLYETHYLENE GLYCOL 3350 17 GM/SCOOP PO POWD: 17.0000 g | Freq: Two times a day (BID) | ORAL | 0 refills | Status: AC | PRN

## 2023-04-28 NOTE — Progress Notes (Signed)
Subjective   Patient ID: Mitchell Steele, male    DOB: 02-23-1996, 28 y.o.   MRN: [id number]  Chief Complaint  Patient presents with   Diabetes    Number been good patient reported    Referring provider: Ivonne Andrew, NP  Mitchell Steele is a 28 y.o. male with Past Medical History: No date: Cancer (HCC) No date: Diabetes mellitus without complication (HCC) No date: Eczema No date: Migraine No date: Obesity   HPI  Patient presents today for follow-up on diabetes and physical.  Patient does check blood sugars at home.  Blood sugars have been ranging between 80 and 120.  Patient checks blood sugar daily.  He is currently taking metformin.  He is also on lisinopril. A1C is 4.9 today. Denies f/c/s, n/v/d, hemoptysis, PND, leg swelling. Denies chest pain or edema.      No Known Allergies  Immunization History  Administered Date(s) Administered   HPV Quadrivalent 04/03/2012   Influenza, Seasonal, Injecte, Preservative Fre 04/03/2012    Tobacco History: Social History   Tobacco Use  Smoking Status Former   Current packs/day: 0.00   Types: Cigars, Cigarettes   Quit date: 08/09/2021   Years since quitting: 1.7  Smokeless Tobacco Never   Counseling given: Not Answered   Outpatient Encounter Medications as of 04/28/2023  Medication Sig   lisinopril (ZESTRIL) 10 MG tablet Take 1 tablet (10 mg total) by mouth daily.   metFORMIN (GLUCOPHAGE-XR) 500 MG 24 hr tablet Take 1 tablet (500 mg total) by mouth daily with breakfast.   polyethylene glycol powder (GLYCOLAX/MIRALAX) 17 GM/SCOOP powder Take 17 g by mouth 2 (two) times daily as needed for up to 7 days.   predniSONE (DELTASONE) 20 MG tablet Take 1 tablet (20 mg total) by mouth daily with breakfast for 5 days.   [DISCONTINUED] cyclobenzaprine (FLEXERIL) 10 MG tablet Take 1 tablet (10 mg total) by mouth 2 (two) times daily as needed for muscle spasms. (Patient not taking: Reported on 04/28/2023)   [DISCONTINUED] docusate  sodium (COLACE) 100 MG capsule Take 1 capsule (100 mg total) by mouth 2 (two) times daily. (Patient not taking: Reported on 04/28/2023)   [DISCONTINUED] lidocaine (LIDODERM) 5 % Place 1 patch onto the skin daily. Remove & Discard patch within 12 hours or as directed by MD (Patient not taking: Reported on 04/28/2023)   [DISCONTINUED] metFORMIN (GLUCOPHAGE-XR) 500 MG 24 hr tablet Take 1 tablet (500 mg total) by mouth daily with breakfast.   [DISCONTINUED] naproxen (NAPROSYN) 500 MG tablet Take 1 tablet (500 mg total) by mouth 2 (two) times daily. (Patient not taking: Reported on 04/28/2023)   [DISCONTINUED] polyethylene glycol (MIRALAX) 17 g packet Take 17 g by mouth daily as needed. (Patient not taking: Reported on 04/28/2023)   [DISCONTINUED] triamcinolone cream (KENALOG) 0.1 % Apply 1 application topically 2 (two) times daily. (Patient not taking: Reported on 04/28/2023)   No facility-administered encounter medications on file as of 04/28/2023.    Review of Systems  Review of Systems  Constitutional: Negative.   HENT: Negative.    Cardiovascular: Negative.   Gastrointestinal: Negative.   Allergic/Immunologic: Negative.   Neurological: Negative.   Psychiatric/Behavioral: Negative.       Objective:   BP 123/76   Pulse 68   Temp (!) 97.2 F (36.2 C)   Wt 242 lb (109.8 kg)   SpO2 100%   BMI 40.27 kg/m   Wt Readings from Last 5 Encounters:  04/28/23 242 lb (109.8 kg)  10/21/22 217 lb (  98.4 kg)  05/13/22 230 lb (104.3 kg)  04/09/22 226 lb 3.2 oz (102.6 kg)  02/10/22 236 lb (107 kg)     Physical Exam Vitals and nursing note reviewed.  Constitutional:      General: He is not in acute distress.    Appearance: He is well-developed.  Cardiovascular:     Rate and Rhythm: Normal rate and regular rhythm.  Pulmonary:     Effort: Pulmonary effort is normal.     Breath sounds: Normal breath sounds.  Skin:    General: Skin is warm and dry.  Neurological:     Mental Status: He is alert  and oriented to person, place, and time.       Assessment & Plan:   Type 2 diabetes mellitus without complication, without long-term current use of insulin (HCC) -     POCT glycosylated hemoglobin (Hb A1C) -     CBC -     Comprehensive metabolic panel -     Ambulatory referral to Optometry  Rib pain on left side -     predniSONE; Take 1 tablet (20 mg total) by mouth daily with breakfast for 5 days.  Dispense: 5 tablet; Refill: 0  Constipation, unspecified constipation type -     Polyethylene Glycol 3350; Take 17 g by mouth 2 (two) times daily as needed for up to 7 days.  Dispense: 238 g; Refill: 0  Routine health maintenance -     CBC -     Comprehensive metabolic panel -     Lipid panel -     Thyroid Panel With TSH  Thyroid disorder screen -     Thyroid Panel With TSH  Lipid screening -     Lipid panel  Encounter for hepatitis C screening test for low risk patient -     Hepatitis C antibody     Return in about 3 months (around 07/27/2023).   Ivonne Andrew, NP 04/28/2023

## 2023-04-29 LAB — CBC
Hematocrit: 43.4 % (ref 37.5–51.0)
Hemoglobin: 14.4 g/dL (ref 13.0–17.7)
MCH: 28 pg (ref 26.6–33.0)
MCHC: 33.2 g/dL (ref 31.5–35.7)
MCV: 84 fL (ref 79–97)
Platelets: 259 10*3/uL (ref 150–450)
RBC: 5.15 x10E6/uL (ref 4.14–5.80)
RDW: 13.4 % (ref 11.6–15.4)
WBC: 6.8 10*3/uL (ref 3.4–10.8)

## 2023-04-29 LAB — HEPATITIS C ANTIBODY: Hep C Virus Ab: NONREACTIVE

## 2023-04-29 LAB — COMPREHENSIVE METABOLIC PANEL
ALT: 24 [IU]/L (ref 0–44)
AST: 21 [IU]/L (ref 0–40)
Albumin: 4.6 g/dL (ref 4.3–5.2)
Alkaline Phosphatase: 75 [IU]/L (ref 44–121)
BUN/Creatinine Ratio: 15 (ref 9–20)
BUN: 15 mg/dL (ref 6–20)
Bilirubin Total: 0.4 mg/dL (ref 0.0–1.2)
CO2: 26 mmol/L (ref 20–29)
Calcium: 9.7 mg/dL (ref 8.7–10.2)
Chloride: 99 mmol/L (ref 96–106)
Creatinine, Ser: 0.99 mg/dL (ref 0.76–1.27)
Globulin, Total: 2.2 g/dL (ref 1.5–4.5)
Glucose: 89 mg/dL (ref 70–99)
Potassium: 4.2 mmol/L (ref 3.5–5.2)
Sodium: 141 mmol/L (ref 134–144)
Total Protein: 6.8 g/dL (ref 6.0–8.5)
eGFR: 107 mL/min/{1.73_m2} (ref >59)

## 2023-04-29 LAB — THYROID PANEL WITH TSH
Free Thyroxine Index: 2.3 (ref 1.2–4.9)
T3 Uptake Ratio: 28 % (ref 24–39)
T4, Total: 8.3 ug/dL (ref 4.5–12.0)
TSH: 0.312 u[IU]/mL — ABNORMAL LOW (ref 0.450–4.500)

## 2023-04-29 LAB — LIPID PANEL
Chol/HDL Ratio: 2.6 {ratio} (ref 0.0–5.0)
Cholesterol, Total: 148 mg/dL (ref 100–199)
HDL: 58 mg/dL (ref >39)
LDL Chol Calc (NIH): 74 mg/dL (ref 0–99)
Triglycerides: 87 mg/dL (ref 0–149)
VLDL Cholesterol Cal: 16 mg/dL (ref 5–40)

## 2023-06-09 ENCOUNTER — Encounter: Payer: Self-pay | Admitting: Nurse Practitioner

## 2023-06-09 ENCOUNTER — Ambulatory Visit (INDEPENDENT_AMBULATORY_CARE_PROVIDER_SITE_OTHER): Payer: Self-pay | Admitting: Nurse Practitioner

## 2023-06-09 VITALS — BP 127/70 | HR 77 | Temp 97.9°F | Wt 252.8 lb

## 2023-06-09 DIAGNOSIS — R221 Localized swelling, mass and lump, neck: Secondary | ICD-10-CM

## 2023-06-09 DIAGNOSIS — H9201 Otalgia, right ear: Secondary | ICD-10-CM

## 2023-06-09 DIAGNOSIS — I152 Hypertension secondary to endocrine disorders: Secondary | ICD-10-CM

## 2023-06-09 MED ORDER — FLUTICASONE PROPIONATE 50 MCG/ACT NA SUSP: 2.0000 | Freq: Every day | NASAL | 6 refills | Status: DC

## 2023-06-09 MED ORDER — OFLOXACIN 0.3 % OT SOLN: 5.0000 [drp] | Freq: Every day | OTIC | 0 refills | Status: DC

## 2023-06-09 MED ORDER — LISINOPRIL 10 MG PO TABS: 10.0000 mg | ORAL_TABLET | Freq: Every day | ORAL | 0 refills | Status: DC

## 2023-06-09 NOTE — Progress Notes (Signed)
 Subjective   Patient ID: Mitchell Steele, male    DOB: Aug 28, 1995, 27 y.o.   MRN: [id number]  Chief Complaint  Patient presents with   Mass    Lump on the back of my neck (right side)   Medical Management of Chronic Issues    My right ear is clogged    Referring provider: Ivonne Andrew, NP  Mitchell Steele is a 28 y.o. male with Past Medical History: No date: Cancer (HCC) No date: Diabetes mellitus without complication (HCC) No date: Eczema No date: Migraine No date: Obesity  HPI  Patient presents today for an acute visit.  He states that he has been having a lump to the posterior region of his neck for the past month.  We will order ultrasound.  Patient has also been having popping and pain to his right inner ear.  We will order eardrops.Denies f/c/s, n/v/d, hemoptysis, PND, leg swelling Denies chest pain or edema    No Known Allergies  Immunization History  Administered Date(s) Administered   HPV Quadrivalent 04/03/2012   Influenza, Seasonal, Injecte, Preservative Fre 04/03/2012    Tobacco History: Social History   Tobacco Use  Smoking Status Former   Current packs/day: 0.00   Types: Cigars, Cigarettes   Quit date: 08/09/2021   Years since quitting: 1.8  Smokeless Tobacco Never   Counseling given: Not Answered   Outpatient Encounter Medications as of 06/09/2023  Medication Sig   fluticasone (FLONASE) 50 MCG/ACT nasal spray Place 2 sprays into both nostrils daily.   metFORMIN (GLUCOPHAGE-XR) 500 MG 24 hr tablet Take 1 tablet (500 mg total) by mouth daily with breakfast.   ofloxacin (FLOXIN) 0.3 % OTIC solution Place 5 drops into the right ear daily.   [DISCONTINUED] lisinopril (ZESTRIL) 10 MG tablet Take 1 tablet (10 mg total) by mouth daily.   lisinopril (ZESTRIL) 10 MG tablet Take 1 tablet (10 mg total) by mouth daily.   No facility-administered encounter medications on file as of 06/09/2023.    Review of Systems  Review of Systems  Constitutional:  Negative.   HENT:  Positive for ear pain.   Cardiovascular: Negative.   Gastrointestinal: Negative.   Skin:        Lump to right neck  Allergic/Immunologic: Negative.   Neurological: Negative.   Psychiatric/Behavioral: Negative.       Objective:   BP 127/70   Pulse 77   Temp 97.9 F (36.6 C) (Oral)   Wt 252 lb 12.8 oz (114.7 kg)   SpO2 100%   BMI 42.07 kg/m   Wt Readings from Last 5 Encounters:  06/09/23 252 lb 12.8 oz (114.7 kg)  04/28/23 242 lb (109.8 kg)  10/21/22 217 lb (98.4 kg)  05/13/22 230 lb (104.3 kg)  04/09/22 226 lb 3.2 oz (102.6 kg)     Physical Exam Vitals and nursing note reviewed.  Constitutional:      General: He is not in acute distress.    Appearance: He is well-developed.  Neck:      Comments: Lump noted to region marked.  Cardiovascular:     Rate and Rhythm: Normal rate and regular rhythm.  Pulmonary:     Effort: Pulmonary effort is normal.     Breath sounds: Normal breath sounds.  Skin:    General: Skin is warm and dry.  Neurological:     Mental Status: He is alert and oriented to person, place, and time.       Assessment & Plan:   Localized  swelling, mass and lump, neck -     US SOFT TISSUE HEAD & NECK (NON-THYROID)  Right ear pain -     Ofloxacin; Place 5 drops into the right ear daily.  Dispense: 5 mL; Refill: 0  Hypertension due to endocrine disorder -     Lisinopril; Take 1 tablet (10 mg total) by mouth daily.  Dispense: 30 tablet; Refill: 0  Other orders -     Fluticasone Propionate; Place 2 sprays into both nostrils daily.  Dispense: 16 g; Refill: 6     Return in about 3 months (around 09/09/2023), or if symptoms worsen or fail to improve.   Ivonne Andrew, NP 06/09/2023

## 2023-06-09 NOTE — Patient Instructions (Signed)
 1. Localized swelling, mass and lump, neck (Primary)  - US Soft Tissue Head/Neck (NON-THYROID)  2. Right ear pain  - ofloxacin (FLOXIN) 0.3 % OTIC solution; Place 5 drops into the right ear daily.  Dispense: 5 mL; Refill: 0  3. Hypertension due to endocrine disorder  - lisinopril (ZESTRIL) 10 MG tablet; Take 1 tablet (10 mg total) by mouth daily.  Dispense: 30 tablet; Refill: 0   Follow up:  Follow up as scheduled

## 2023-06-14 ENCOUNTER — Other Ambulatory Visit: Payer: Self-pay

## 2023-06-14 DIAGNOSIS — E119 Type 2 diabetes mellitus without complications: Secondary | ICD-10-CM

## 2023-06-14 MED ORDER — METFORMIN HCL ER 500 MG PO TB24: 500.0000 mg | ORAL_TABLET | Freq: Every day | ORAL | 0 refills | Status: DC

## 2023-06-15 ENCOUNTER — Inpatient Hospital Stay
Admission: RE | Admit: 2023-06-15 | Discharge: 2023-06-15 | Disposition: A | Discharge status: Home / Self Care | Source: Ambulatory Visit | Attending: Nurse Practitioner | Admitting: Nurse Practitioner

## 2023-06-29 ENCOUNTER — Ambulatory Visit: Payer: Medicaid Other | Admitting: Nurse Practitioner

## 2023-07-21 ENCOUNTER — Other Ambulatory Visit: Payer: Self-pay

## 2023-07-21 DIAGNOSIS — E119 Type 2 diabetes mellitus without complications: Secondary | ICD-10-CM

## 2023-07-21 MED ORDER — METFORMIN HCL ER 500 MG PO TB24: 500.0000 mg | ORAL_TABLET | Freq: Every day | ORAL | 0 refills | Status: DC

## 2023-07-29 ENCOUNTER — Telehealth: Payer: Self-pay | Admitting: Nurse Practitioner

## 2023-07-29 ENCOUNTER — Encounter: Payer: Self-pay | Admitting: Nurse Practitioner

## 2023-07-29 DIAGNOSIS — E119 Type 2 diabetes mellitus without complications: Secondary | ICD-10-CM

## 2023-07-29 DIAGNOSIS — Z1329 Encounter for screening for other suspected endocrine disorder: Secondary | ICD-10-CM

## 2023-07-29 DIAGNOSIS — I152 Hypertension secondary to endocrine disorders: Secondary | ICD-10-CM

## 2023-07-29 MED ORDER — LISINOPRIL 10 MG PO TABS: 10.0000 mg | ORAL_TABLET | Freq: Every day | ORAL | 0 refills | Status: DC

## 2023-07-29 NOTE — Progress Notes (Signed)
 Virtual Visit via Video Note  I connected with Mitchell Steele on 07/29/23 at  9:00 AM EDT by a video enabled telemedicine application and verified that I am speaking with the correct person using two identifiers.  Location: Patient: home Provider: office   I discussed the limitations of evaluation and management by telemedicine and the availability of in person appointments. The patient expressed understanding and agreed to proceed.  History of Present Illness:  Patient presents today for follow-up on diabetes and physical.  Patient does check blood sugars at home.  Blood sugars have been ranging between 80-92.  Patient checks blood sugar daily.  He is currently taking metformin .  He is also on lisinopril . A1C was 4.9 at last visit.  Patient does complain today of left side abdominal pain that is intermittent.  He states that he does have past history of constipation.  He states that recently he has not felt constipated.  He does have previous history of elevated liver function.  We will have him return next week for lab visit.  Denies f/c/s, n/v/d, hemoptysis, PND, leg swelling. Denies chest pain or edema     Observations/Objective:     06/09/2023    9:25 AM 04/28/2023    9:31 AM 10/21/2022    1:45 PM  Vitals with BMI  Height   5\' 5"   Weight 252 lbs 13 oz 242 lbs 217 lbs  BMI   36.11  Systolic 127 123 161  Diastolic 70 76 72  Pulse 77 68 66      Assessment and Plan:  1. Hypertension due to endocrine disorder  - lisinopril  (ZESTRIL ) 10 MG tablet; Take 1 tablet (10 mg total) by mouth daily.  Dispense: 30 tablet; Refill: 0 - CBC; Future - Comprehensive metabolic panel with GFR; Future - Thyroid  Panel With TSH; Future  2. Thyroid  disorder screen (Primary)  - Thyroid  Panel With TSH; Future  3. Type 2 diabetes mellitus without complication, without long-term current use of insulin  (HCC)  - Hemoglobin A1c; Future     I discussed the assessment and treatment plan with the  patient. The patient was provided an opportunity to ask questions and all were answered. The patient agreed with the plan and demonstrated an understanding of the instructions.   The patient was advised to call back or seek an in-person evaluation if the symptoms worsen or if the condition fails to improve as anticipated.  I provided 23 minutes of non-face-to-face time during this encounter.   Jerrlyn Morel, NP

## 2023-07-29 NOTE — Patient Instructions (Signed)

## 2023-08-01 ENCOUNTER — Other Ambulatory Visit: Payer: Self-pay

## 2023-08-08 ENCOUNTER — Ambulatory Visit (INDEPENDENT_AMBULATORY_CARE_PROVIDER_SITE_OTHER): Payer: Self-pay | Admitting: Nurse Practitioner

## 2023-08-08 ENCOUNTER — Encounter: Payer: Self-pay | Admitting: Nurse Practitioner

## 2023-08-08 ENCOUNTER — Telehealth: Payer: Self-pay

## 2023-08-08 VITALS — BP 134/83 | HR 59 | Temp 98.3°F | Wt 253.0 lb

## 2023-08-08 DIAGNOSIS — I152 Hypertension secondary to endocrine disorders: Secondary | ICD-10-CM

## 2023-08-08 DIAGNOSIS — E119 Type 2 diabetes mellitus without complications: Secondary | ICD-10-CM

## 2023-08-08 DIAGNOSIS — G8929 Other chronic pain: Secondary | ICD-10-CM

## 2023-08-08 DIAGNOSIS — R109 Unspecified abdominal pain: Secondary | ICD-10-CM

## 2023-08-08 DIAGNOSIS — Z1329 Encounter for screening for other suspected endocrine disorder: Secondary | ICD-10-CM

## 2023-08-08 LAB — POCT GLYCOSYLATED HEMOGLOBIN (HGB A1C): Hemoglobin A1C: 5 % (ref 4.0–5.6)

## 2023-08-08 NOTE — Progress Notes (Signed)
   Subjective   Patient ID: Mitchell Steele, male    DOB: April 13, 1995, 28 y.o.   MRN: [id number]  Chief Complaint  Patient presents with   Abdominal Pain    Patient stated that he think he may have IBS    Referring provider: Jerrlyn Morel, NP  Mitchell Steele is a 28 y.o. male with Past Medical History: No date: Cancer (HCC) No date: Diabetes mellitus without complication (HCC) No date: Eczema No date: Migraine No date: Obesity   HPI  Patient presents today for follow-up on diabetes and physical.  Patient does check blood sugars at home.  Blood sugars have been ranging between 80 and 120.  Patient checks blood sugar daily.  He is currently taking metformin .  He is also on lisinopril . A1C is 5.0 today. Denies f/c/s, n/v/d, hemoptysis, PND, leg swelling. Denies chest pain or edema.   Patient is requesting another referral back to GI for chronic abdominal pain.  He also does have periods of constipation and diarrhea.  He is concerned for irritable bowel syndrome.     No Known Allergies  Immunization History  Administered Date(s) Administered   HPV Quadrivalent 04/03/2012   Influenza, Seasonal, Injecte, Preservative Fre 04/03/2012    Tobacco History: Social History   Tobacco Use  Smoking Status Former   Current packs/day: 0.00   Types: Cigars, Cigarettes   Quit date: 08/09/2021   Years since quitting: 1.9  Smokeless Tobacco Never   Counseling given: Not Answered   Outpatient Encounter Medications as of 08/08/2023  Medication Sig   lisinopril  (ZESTRIL ) 10 MG tablet Take 1 tablet (10 mg total) by mouth daily.   metFORMIN  (GLUCOPHAGE -XR) 500 MG 24 hr tablet Take 1 tablet (500 mg total) by mouth daily with breakfast.   No facility-administered encounter medications on file as of 08/08/2023.    Review of Systems  Review of Systems  Constitutional: Negative.   HENT: Negative.    Cardiovascular: Negative.   Gastrointestinal: Negative.   Allergic/Immunologic: Negative.    Neurological: Negative.   Psychiatric/Behavioral: Negative.       Objective:   BP 134/83   Pulse (!) 59   Temp 98.3 F (36.8 C) (Oral)   Wt 253 lb (114.8 kg)   SpO2 98%   BMI 42.10 kg/m   Wt Readings from Last 5 Encounters:  08/08/23 253 lb (114.8 kg)  06/09/23 252 lb 12.8 oz (114.7 kg)  04/28/23 242 lb (109.8 kg)  10/21/22 217 lb (98.4 kg)  05/13/22 230 lb (104.3 kg)     Physical Exam Vitals and nursing note reviewed.  Constitutional:      General: He is not in acute distress.    Appearance: He is well-developed.  Cardiovascular:     Rate and Rhythm: Regular rhythm.  Pulmonary:     Effort: Pulmonary effort is normal.     Breath sounds: Normal breath sounds.  Skin:    General: Skin is warm and dry.  Neurological:     Mental Status: He is alert and oriented to person, place, and time.       Assessment & Plan:   Type 2 diabetes mellitus without complication, without long-term current use of insulin  (HCC) -     POCT glycosylated hemoglobin (Hb A1C)  Chronic abdominal pain -     Ambulatory referral to Gastroenterology     Return in about 6 months (around 02/08/2024).   Jerrlyn Morel, NP 08/08/2023

## 2023-08-08 NOTE — Telephone Encounter (Signed)
 Copied from CRM [id number]. Topic: General - Call Back - No Documentation >> Aug 08, 2023 10:32 AM Luane Rumps D wrote: Reason for CRM: Patient said he was returning a missed call from a nurse, would like a call back if needed.

## 2023-08-08 NOTE — Patient Instructions (Signed)
 1. Type 2 diabetes mellitus without complication, without long-term current use of insulin  (HCC) (Primary)  - POCT glycosylated hemoglobin (Hb A1C)  2. Chronic abdominal pain  - Ambulatory referral to Gastroenterology

## 2023-08-09 ENCOUNTER — Other Ambulatory Visit: Payer: Self-pay | Admitting: Nurse Practitioner

## 2023-08-09 DIAGNOSIS — E0590 Thyrotoxicosis, unspecified without thyrotoxic crisis or storm: Secondary | ICD-10-CM

## 2023-08-09 LAB — CBC
Hematocrit: 45.5 % (ref 37.5–51.0)
Hemoglobin: 15.4 g/dL (ref 13.0–17.7)
MCH: 27.9 pg (ref 26.6–33.0)
MCHC: 33.8 g/dL (ref 31.5–35.7)
MCV: 83 fL (ref 79–97)
Platelets: 236 10*3/uL (ref 150–450)
RBC: 5.51 x10E6/uL (ref 4.14–5.80)
RDW: 12.8 % (ref 11.6–15.4)
WBC: 6.4 10*3/uL (ref 3.4–10.8)

## 2023-08-09 LAB — THYROID PANEL WITH TSH
Free Thyroxine Index: 2.3 (ref 1.2–4.9)
T3 Uptake Ratio: 27 % (ref 24–39)
T4, Total: 8.6 ug/dL (ref 4.5–12.0)
TSH: 0.251 u[IU]/mL — ABNORMAL LOW (ref 0.450–4.500)

## 2023-08-09 LAB — HEMOGLOBIN A1C
Est. average glucose Bld gHb Est-mCnc: 97 mg/dL
Hgb A1c MFr Bld: 5 % (ref 4.8–5.6)

## 2023-08-09 LAB — COMPREHENSIVE METABOLIC PANEL WITH GFR
ALT: 19 IU/L (ref 0–44)
AST: 15 IU/L (ref 0–40)
Albumin: 4.7 g/dL (ref 4.3–5.2)
Alkaline Phosphatase: 64 IU/L (ref 44–121)
BUN/Creatinine Ratio: 15 (ref 9–20)
BUN: 14 mg/dL (ref 6–20)
Bilirubin Total: 0.7 mg/dL (ref 0.0–1.2)
CO2: 24 mmol/L (ref 20–29)
Calcium: 9.9 mg/dL (ref 8.7–10.2)
Chloride: 103 mmol/L (ref 96–106)
Creatinine, Ser: 0.95 mg/dL (ref 0.76–1.27)
Globulin, Total: 2.5 g/dL (ref 1.5–4.5)
Glucose: 79 mg/dL (ref 70–99)
Potassium: 4.1 mmol/L (ref 3.5–5.2)
Sodium: 141 mmol/L (ref 134–144)
Total Protein: 7.2 g/dL (ref 6.0–8.5)
eGFR: 112 mL/min/{1.73_m2} (ref >59)

## 2023-08-26 ENCOUNTER — Encounter: Payer: Self-pay | Admitting: Nurse Practitioner

## 2023-08-26 ENCOUNTER — Ambulatory Visit (INDEPENDENT_AMBULATORY_CARE_PROVIDER_SITE_OTHER): Payer: Self-pay | Admitting: Nurse Practitioner

## 2023-08-26 VITALS — BP 120/70 | HR 71 | Ht 65.0 in | Wt 258.2 lb

## 2023-08-26 DIAGNOSIS — R1012 Left upper quadrant pain: Secondary | ICD-10-CM

## 2023-08-26 DIAGNOSIS — K5900 Constipation, unspecified: Secondary | ICD-10-CM

## 2023-08-26 DIAGNOSIS — Z7984 Long term (current) use of oral hypoglycemic drugs: Secondary | ICD-10-CM

## 2023-08-26 DIAGNOSIS — E119 Type 2 diabetes mellitus without complications: Secondary | ICD-10-CM

## 2023-08-26 NOTE — Progress Notes (Signed)
 08/26/2023 Mitchell Steele [id number] 04/19/1995   CHIEF COMPLAINT: Abdominal pain   HISTORY OF PRESENT ILLNESS: Mitchell Steele is a 28 year old male with a past medical history of obesity, migraine headaches and diabetes mellitus type II initially diagnosed at the age of 3 on Metformin . No past surgical history. He presents to our office today as referred by Abbey Hobby for further evaluation regarding LUQ pain which initially started 2 to 3 months ago described as sharp and burning and occurred on and off  for 3 to 4 hours. Eating did not trigger or worsen pain.  No heartburn or dysphagia. He noted having some constipation, passing smaller hard stools twice weekly which may have triggered his abdominal pain. He also thought smoking cigars may have triggered his pain. He started drinking more water and took a stool softener and his bowel movements improved, no longer feels constipated and he stopped smoking cigars and his LUQ abated one month ago. No weight loss. He recently started going back to the gyn and intends to exercise on a regular basis and to lose weight. No NSAID use. He eats healthy and fast foods. For breakfast he tends to eat [place] sausage, eggs and fruit, lunch burger and fries and dinner chicken with rice or potatoes and a vegetable.  His mother was on speaker phone throughout today's office visit.     Latest Ref Rng & Units 08/08/2023   10:42 AM 04/28/2023   10:04 AM 10/21/2022    2:17 PM  CBC  WBC 3.4 - 10.8 x10E3/uL 6.4  6.8  8.0   Hemoglobin 13.0 - 17.7 g/dL 56.4  33.2  95.1   Hematocrit 37.5 - 51.0 % 45.5  43.4  44.0   Platelets 150 - 450 x10E3/uL 236  259  253        Latest Ref Rng & Units 08/08/2023   10:42 AM 04/28/2023   10:04 AM 10/21/2022    2:17 PM  CMP  Glucose 70 - 99 mg/dL 79  89  76   BUN 6 - 20 mg/dL 14  15  15    Creatinine 0.76 - 1.27 mg/dL 8.84  1.66  0.63   Sodium 134 - 144 mmol/L 141  141  139   Potassium 3.5 - 5.2 mmol/L 4.1  4.2  4.0   Chloride  96 - 106 mmol/L 103  99  100   CO2 20 - 29 mmol/L 24  26  26    Calcium 8.7 - 10.2 mg/dL 9.9  9.7  9.7   Total Protein 6.0 - 8.5 g/dL 7.2  6.8  7.0   Total Bilirubin 0.0 - 1.2 mg/dL 0.7  0.4  0.5   Alkaline Phos 44 - 121 IU/L 64  75  64   AST 0 - 40 IU/L 15  21  10    ALT 0 - 44 IU/L 19  24  12    TSH 0.251.  Normal T4 and T3 levels  Social History: He smokes cigars, last smoked a cigar one month ago. He drinks two glasses of wine on the holidays. No drug use.   Family History: Paternal uncle prostate cancer. No known family history of esophageal, gastric or colon cancer.  Mother with diabetes.  Maternal grandmother with diabetes and heart disease.  No Known Allergies   Outpatient Encounter Medications as of 08/26/2023  Medication Sig   lisinopril  (ZESTRIL ) 10 MG tablet Take 1 tablet (10 mg total) by mouth daily.   metFORMIN  (GLUCOPHAGE -XR) 500 MG  24 hr tablet Take 1 tablet (500 mg total) by mouth daily with breakfast.   No facility-administered encounter medications on file as of 08/26/2023.   REVIEW OF SYSTEMS:  Gen: Denies fever, sweats or chills. No weight loss.  CV: Denies chest pain, palpitations or edema. Resp: Denies cough, shortness of breath of hemoptysis.  GI: See HPI. GU: Denies urinary burning, blood in urine, increased urinary frequency or incontinence. MS: Denies joint pain, muscles aches or weakness. Derm: Denies rash, itchiness, skin lesions or unhealing ulcers. Psych: Denies depression, anxiety, memory loss or confusion. Heme: Denies bruising, easy bleeding. Neuro:  + Headaches.  Endo: + DM type II.  PHYSICAL EXAM: BP 120/70 (BP Location: Left Arm, Patient Position: Sitting, Cuff Size: Large)   Pulse 71   Ht 5\' 5"  (1.651 m) Comment: height measured without shoes  Wt 258 lb 4 oz (117.1 kg)   BMI 42.98 kg/m   General: 28 year old obese male in no acute distress. Head: Normocephalic and atraumatic. Eyes:  Sclerae non-icteric, conjunctive pink. Ears: Normal  auditory acuity. Mouth: Dentition intact. No ulcers or lesions.  Neck: Supple, no lymphadenopathy or thyromegaly.  Lungs: Clear bilaterally to auscultation without wheezes, crackles or rhonchi. Heart: Regular rate and rhythm. No murmur, rub or gallop appreciated.  Abdomen: Soft, nontender, nondistended. No masses. No hepatosplenomegaly. Normoactive bowel sounds x 4 quadrants.  Rectal: Deferred. Musculoskeletal: Symmetrical with no gross deformities. Skin: Warm and dry. No rash or lesions on visible extremities. Extremities: No edema. Neurological: Alert oriented x 4, no focal deficits.  Psychological: Alert and cooperative. Normal mood and affect.  ASSESSMENT AND PLAN:  28 year old male with intermittent LUQ pain which started 2 to 3 months ago and abated 1 month ago after constipation resolved and  after he stopped smoking cigars. - Avoid fatty/acidic foods, reduce carbohydrates i.e. bread/pasta/rice/potatoes, exercise as tolerated and lose weight - Consider CTAP if LUQ pain recurs - Patient to contact office if symptoms recur - Follow-up as needed  Constipation - Drink 64 ounces of water daily - Benefiber 1 tablespoon daily as tolerated - MiraLAX  nightly as needed  Diabetes mellitus type 2 on Metformin     CC:  Mitchell Morel, NP

## 2023-08-26 NOTE — Patient Instructions (Addendum)
 Increase water intake to 64 ounces daily  Benefiber one tablespoon daily to avoid constipation  Take Miralax  1 capful mixed in 8 ounces of water at bed time for constipation as tolerated  Contact our office if you left upper abdominal pain recurs   Avoid tobacco/cigar use   _______________________________________________________  If your blood pressure at your visit was 140/90 or greater, please contact your primary care physician to follow up on this.  _______________________________________________________  If you are age 12 or older, your body mass index should be between 23-30. Your Body mass index is 42.98 kg/m. If this is out of the aforementioned range listed, please consider follow up with your Primary Care Provider.  If you are age 24 or younger, your body mass index should be between 19-25. Your Body mass index is 42.98 kg/m. If this is out of the aformentioned range listed, please consider follow up with your Primary Care Provider.   ________________________________________________________  The [facility] providers would like to encourage you to use MYCHART to communicate with providers for non-urgent requests or questions.  Due to long hold times on the telephone, sending your provider a message by [facility] may be a faster and more efficient way to get a response.  Please allow 48 business hours for a response.  Please remember that this is for non-urgent requests.  _______________________________________________________

## 2023-09-13 NOTE — Progress Notes (Signed)
 Agree with the assessment and plan as outlined by Alcide Evener, NP.    Zae Kirtz E. Tomasa Rand, MD [facility]

## 2023-10-28 ENCOUNTER — Ambulatory Visit: Payer: Self-pay | Admitting: Nurse Practitioner

## 2023-11-02 ENCOUNTER — Other Ambulatory Visit: Payer: Self-pay | Admitting: Nurse Practitioner

## 2023-11-02 DIAGNOSIS — E119 Type 2 diabetes mellitus without complications: Secondary | ICD-10-CM

## 2023-11-20 ENCOUNTER — Other Ambulatory Visit: Payer: Self-pay | Admitting: Nurse Practitioner

## 2023-11-20 DIAGNOSIS — I152 Hypertension secondary to endocrine disorders: Secondary | ICD-10-CM

## 2023-11-28 ENCOUNTER — Ambulatory Visit: Payer: Self-pay | Admitting: Nurse Practitioner

## 2023-12-19 ENCOUNTER — Ambulatory Visit: Admitting: Nurse Practitioner

## 2023-12-20 ENCOUNTER — Ambulatory Visit (INDEPENDENT_AMBULATORY_CARE_PROVIDER_SITE_OTHER): Payer: Self-pay | Admitting: Nurse Practitioner

## 2023-12-20 ENCOUNTER — Encounter: Payer: Self-pay | Admitting: Nurse Practitioner

## 2023-12-20 VITALS — BP 125/82 | HR 63 | Temp 98.4°F | Wt 252.4 lb

## 2023-12-20 DIAGNOSIS — E119 Type 2 diabetes mellitus without complications: Secondary | ICD-10-CM

## 2023-12-20 LAB — POCT GLYCOSYLATED HEMOGLOBIN (HGB A1C): Hemoglobin A1C: 5 % (ref 4.0–5.6)

## 2023-12-20 NOTE — Progress Notes (Signed)
   Subjective   Patient ID: Mitchell Steele, male    DOB: 12-01-1995, 28 y.o.   MRN: [id number]  Chief Complaint  Patient presents with   Diabetes    Referring provider: Oley Bascom RAMAN, NP  Mitchell Steele is a 28 y.o. male with Past Medical History: No date: Depression No date: Diabetes mellitus without complication (HCC) No date: Eczema No date: Migraine No date: Obesity   HPI  Patient presents today for follow-up on diabetes and physical.  Patient does check blood sugars at home.  Blood sugars have been ranging between 80 and 120.  Patient checks blood sugar daily.  He is currently taking metformin .  He is also on lisinopril . A1C is 5.0 today. Denies f/c/s, n/v/d, hemoptysis, PND, leg swelling. Denies chest pain or edema.     No Known Allergies  Immunization History  Administered Date(s) Administered   HPV Quadrivalent 04/03/2012   Influenza, Seasonal, Injecte, Preservative Fre 04/03/2012    Tobacco History: Social History   Tobacco Use  Smoking Status Former   Types: Cigars  Smokeless Tobacco Never   Counseling given: Not Answered   Outpatient Encounter Medications as of 12/20/2023  Medication Sig   lisinopril  (ZESTRIL ) 10 MG tablet Take 1 tablet by mouth once daily   metFORMIN  (GLUCOPHAGE -XR) 500 MG 24 hr tablet Take 1 tablet by mouth once daily with breakfast   No facility-administered encounter medications on file as of 12/20/2023.    Review of Systems  Review of Systems  Constitutional: Negative.   HENT: Negative.    Cardiovascular: Negative.   Gastrointestinal: Negative.   Allergic/Immunologic: Negative.   Neurological: Negative.   Psychiatric/Behavioral: Negative.       Objective:   BP 125/82   Pulse 63   Temp 98.4 F (36.9 C) (Oral)   Wt 252 lb 6.4 oz (114.5 kg)   SpO2 100%   BMI 42.00 kg/m   Wt Readings from Last 5 Encounters:  12/20/23 252 lb 6.4 oz (114.5 kg)  08/26/23 258 lb 4 oz (117.1 kg)  08/08/23 253 lb (114.8 kg)   06/09/23 252 lb 12.8 oz (114.7 kg)  04/28/23 242 lb (109.8 kg)     Physical Exam Vitals and nursing note reviewed.  Constitutional:      General: He is not in acute distress.    Appearance: He is well-developed.  Cardiovascular:     Rate and Rhythm: Normal rate and regular rhythm.  Pulmonary:     Effort: Pulmonary effort is normal.     Breath sounds: Normal breath sounds.  Skin:    General: Skin is warm and dry.  Neurological:     Mental Status: He is alert and oriented to person, place, and time.       Assessment & Plan:   Type 2 diabetes mellitus without complication, without long-term current use of insulin  (HCC) -     POCT glycosylated hemoglobin (Hb A1C)     Return in about 6 months (around 06/18/2024).   Bascom RAMAN Oley, NP 12/20/2023

## 2024-01-04 ENCOUNTER — Other Ambulatory Visit: Payer: Self-pay | Admitting: Nurse Practitioner

## 2024-01-04 DIAGNOSIS — I152 Hypertension secondary to endocrine disorders: Secondary | ICD-10-CM

## 2024-01-04 DIAGNOSIS — E119 Type 2 diabetes mellitus without complications: Secondary | ICD-10-CM

## 2024-02-08 ENCOUNTER — Ambulatory Visit: Payer: Self-pay | Admitting: Nurse Practitioner

## 2024-02-10 ENCOUNTER — Ambulatory Visit (INDEPENDENT_AMBULATORY_CARE_PROVIDER_SITE_OTHER): Payer: Self-pay | Admitting: Nurse Practitioner

## 2024-02-10 ENCOUNTER — Encounter: Payer: Self-pay | Admitting: Nurse Practitioner

## 2024-02-10 ENCOUNTER — Telehealth: Payer: Self-pay

## 2024-02-10 VITALS — BP 127/68 | HR 64 | Ht 65.0 in | Wt 260.0 lb

## 2024-02-10 DIAGNOSIS — K5900 Constipation, unspecified: Secondary | ICD-10-CM

## 2024-02-10 DIAGNOSIS — R7989 Other specified abnormal findings of blood chemistry: Secondary | ICD-10-CM

## 2024-02-10 MED ORDER — DOCUSATE SODIUM 100 MG PO CAPS: 100.0000 mg | ORAL_CAPSULE | Freq: Two times a day (BID) | ORAL | 0 refills | Status: AC

## 2024-02-10 MED ORDER — POLYETHYLENE GLYCOL 3350 17 GM/SCOOP PO POWD: 17.0000 g | Freq: Two times a day (BID) | ORAL | 1 refills | Status: DC | PRN

## 2024-02-10 NOTE — Telephone Encounter (Signed)
 Copied from CRM [id number]. Topic: Clinical - Medication Question >> Feb 10, 2024 11:49 AM China J wrote: Reason for CRM: The pharmacy received 2 scripts for polyethylene glycol powder (GLYCOLAX /MIRALAX ) 17 GM/SCOOP powder and each script has different directions. The pharmacist was wondering if a new script could be sent in.

## 2024-02-10 NOTE — Progress Notes (Signed)
 Subjective   Patient ID: Mitchell Steele, male    DOB: 1995-06-13, 28 y.o.   MRN: [id number]  Chief Complaint  Patient presents with   Follow-up    Referring provider: Oley Bascom RAMAN, NP  Mitchell Steele is a 28 y.o. male with Past Medical History: No date: Depression No date: Diabetes mellitus without complication (HCC) No date: Eczema No date: Migraine No date: Obesity  HPI  Patient presents today for follow-up on thyroid .  Thyroid  level was low at last check.  We will recheck today.  Patient does complain of left upper quadrant abdominal pain.  He states that it hurts into his ribs.  He has been having issues with constipation recently.  We will trial MiraLAX  and Colace. Denies f/c/s, n/v/d, hemoptysis, PND, leg swelling Denies chest pain or edema     No Known Allergies  Immunization History  Administered Date(s) Administered   HPV Quadrivalent 04/03/2012   Influenza, Seasonal, Injecte, Preservative Fre 04/03/2012    Tobacco History: Social History   Tobacco Use  Smoking Status Former   Types: Cigars  Smokeless Tobacco Never   Counseling given: Not Answered   Outpatient Encounter Medications as of 02/10/2024  Medication Sig   docusate sodium  (COLACE) 100 MG capsule Take 1 capsule (100 mg total) by mouth 2 (two) times daily.   lisinopril  (ZESTRIL ) 10 MG tablet Take 1 tablet by mouth once daily   metFORMIN  (GLUCOPHAGE -XR) 500 MG 24 hr tablet Take 1 tablet by mouth once daily with breakfast   polyethylene glycol powder (GLYCOLAX /MIRALAX ) 17 GM/SCOOP powder Take 17 g by mouth 2 (two) times daily as needed. Dissolve 1 capful (17g) in 4-8 ounces of liquid and take by mouth daily.   No facility-administered encounter medications on file as of 02/10/2024.    Review of Systems  Review of Systems  Constitutional: Negative.   HENT: Negative.    Cardiovascular: Negative.   Gastrointestinal: Negative.   Allergic/Immunologic: Negative.   Neurological: Negative.    Psychiatric/Behavioral: Negative.       Objective:   BP 127/68   Pulse 64   Ht 5' 5 (1.651 m)   Wt 260 lb (117.9 kg)   SpO2 99%   BMI 43.27 kg/m   Wt Readings from Last 5 Encounters:  02/10/24 260 lb (117.9 kg)  12/20/23 252 lb 6.4 oz (114.5 kg)  08/26/23 258 lb 4 oz (117.1 kg)  08/08/23 253 lb (114.8 kg)  06/09/23 252 lb 12.8 oz (114.7 kg)     Physical Exam Vitals and nursing note reviewed.  Constitutional:      General: He is not in acute distress.    Appearance: He is well-developed.  Cardiovascular:     Rate and Rhythm: Normal rate and regular rhythm.  Pulmonary:     Effort: Pulmonary effort is normal.     Breath sounds: Normal breath sounds.  Skin:    General: Skin is warm and dry.  Neurological:     Mental Status: He is alert and oriented to person, place, and time.       Assessment & Plan:   Low TSH level -     Thyroid  Panel With TSH  Constipation, unspecified constipation type -     Polyethylene Glycol 3350 ; Take 17 g by mouth 2 (two) times daily as needed. Dissolve 1 capful (17g) in 4-8 ounces of liquid and take by mouth daily.  Dispense: 3350 g; Refill: 1 -     Docusate Sodium ; Take 1 capsule (100 mg total)  by mouth 2 (two) times daily.  Dispense: 10 capsule; Refill: 0     Return if symptoms worsen or fail to improve.   Bascom GORMAN Borer, NP 02/10/2024

## 2024-02-11 LAB — THYROID PANEL WITH TSH
Free Thyroxine Index: 2.8 (ref 1.2–4.9)
T3 Uptake Ratio: 29 % (ref 24–39)
T4, Total: 9.7 ug/dL (ref 4.5–12.0)
TSH: 0.258 u[IU]/mL — ABNORMAL LOW (ref 0.450–4.500)

## 2024-02-13 ENCOUNTER — Telehealth: Payer: Self-pay

## 2024-02-13 NOTE — Telephone Encounter (Signed)
 Copied from CRM [id number]. Topic: Clinical - Prescription Issue >> Feb 13, 2024  5:16 PM Tobias CROME wrote: Reason for CRM: [facility] pharmacy requesting clarification on miralax  prescription   Best callback number: [phone]

## 2024-02-15 ENCOUNTER — Other Ambulatory Visit: Payer: Self-pay | Admitting: Nurse Practitioner

## 2024-02-15 ENCOUNTER — Ambulatory Visit: Payer: Self-pay | Admitting: Nurse Practitioner

## 2024-02-15 DIAGNOSIS — R7989 Other specified abnormal findings of blood chemistry: Secondary | ICD-10-CM

## 2024-02-15 DIAGNOSIS — K5900 Constipation, unspecified: Secondary | ICD-10-CM

## 2024-02-15 MED ORDER — POLYETHYLENE GLYCOL 3350 17 GM/SCOOP PO POWD: 17.0000 g | Freq: Two times a day (BID) | ORAL | 1 refills | Status: DC | PRN

## 2024-02-17 ENCOUNTER — Other Ambulatory Visit: Payer: Self-pay

## 2024-02-17 ENCOUNTER — Telehealth: Payer: Self-pay

## 2024-02-17 DIAGNOSIS — K5900 Constipation, unspecified: Secondary | ICD-10-CM

## 2024-02-17 MED ORDER — POLYETHYLENE GLYCOL 3350 17 GM/SCOOP PO POWD: 17.0000 g | Freq: Two times a day (BID) | ORAL | 1 refills | Status: DC | PRN

## 2024-02-17 NOTE — Telephone Encounter (Signed)
 polyethylene glycol powder (GLYCOLAX /MIRALAX ) 17 GM/SCOOP powder [[id number]]

## 2024-02-23 ENCOUNTER — Telehealth: Payer: Self-pay

## 2024-02-23 NOTE — Telephone Encounter (Signed)
 Copied from CRM [id number]. Topic: Clinical - Medication Question >> Feb 23, 2024 11:46 AM Rosaria BRAVO wrote: Reason for CRM: [facility] called needing medication clarification on the directions for   polyethylene glycol powder (GLYCOLAX /MIRALAX ) 17 GM/SCOOP powder

## 2024-02-24 ENCOUNTER — Other Ambulatory Visit: Payer: Self-pay | Admitting: Nurse Practitioner

## 2024-02-24 DIAGNOSIS — K5900 Constipation, unspecified: Secondary | ICD-10-CM

## 2024-02-24 MED ORDER — POLYETHYLENE GLYCOL 3350 17 GM/SCOOP PO POWD: 17.0000 g | Freq: Two times a day (BID) | ORAL | 1 refills | Status: AC | PRN

## 2024-02-24 NOTE — Telephone Encounter (Signed)
 [facility] is calling for clarification on polyethylene glycol powder (GLYCOLAX /MIRALAX ) 17 GM/SCOOP powder sig sent two directions- Is medication once a day or twice a day?   The same script was resent  Please send correct script in.

## 2024-02-24 NOTE — Telephone Encounter (Signed)
 Please advise if pt is taken med every day or bid

## 2024-03-07 NOTE — Telephone Encounter (Signed)
Lvm for pharmacy. KH

## 2024-03-12 ENCOUNTER — Other Ambulatory Visit: Payer: Self-pay | Admitting: Nurse Practitioner

## 2024-03-12 DIAGNOSIS — E119 Type 2 diabetes mellitus without complications: Secondary | ICD-10-CM

## 2024-04-06 ENCOUNTER — Other Ambulatory Visit: Payer: Self-pay | Admitting: Nurse Practitioner

## 2024-04-06 DIAGNOSIS — I152 Hypertension secondary to endocrine disorders: Secondary | ICD-10-CM

## 2024-04-06 NOTE — Telephone Encounter (Signed)
 lisinopril  (ZESTRIL ) 10 MG tablet [Pharmacy Med Name: Lisinopril  10 MG Oral Tablet]

## 2024-05-18 ENCOUNTER — Ambulatory Visit: Payer: Self-pay | Admitting: Nurse Practitioner

## 2024-06-18 ENCOUNTER — Ambulatory Visit: Payer: Self-pay | Admitting: Nurse Practitioner

## 2024-08-14 ENCOUNTER — Ambulatory Visit: Payer: Self-pay | Admitting: "Endocrinology
